# Patient Record
Sex: Female | Born: 2000 | Race: Black or African American | Hispanic: No | Marital: Single | Smoking: Never smoker
Health system: Southern US, Community
[De-identification: ages and names within clinical notes are randomized; demographics above are authoritative.]

## PROBLEM LIST (undated history)

## (undated) ENCOUNTER — Emergency Department (HOSPITAL_COMMUNITY): Admission: EM | Payer: Medicaid Other | Source: Home / Self Care

## (undated) ENCOUNTER — Inpatient Hospital Stay (HOSPITAL_COMMUNITY): Payer: Medicaid Other | Admitting: Obstetrics & Gynecology

## (undated) DIAGNOSIS — Z789 Other specified health status: Secondary | ICD-10-CM

## (undated) HISTORY — DX: Other specified health status: Z78.9

## (undated) HISTORY — PX: NO PAST SURGERIES: SHX2092

## (undated) HISTORY — PX: WISDOM TOOTH EXTRACTION: SHX21

---
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
## 2020-01-20 ENCOUNTER — Emergency Department (HOSPITAL_COMMUNITY)
Admission: EM | Admit: 2020-01-20 | Discharge: 2020-01-20 | Disposition: A | Discharge status: Home / Self Care | Payer: Medicaid Other | Attending: Emergency Medicine | Admitting: Emergency Medicine

## 2020-01-20 ENCOUNTER — Other Ambulatory Visit: Payer: Self-pay

## 2020-01-20 ENCOUNTER — Encounter (HOSPITAL_COMMUNITY): Payer: Self-pay | Admitting: Emergency Medicine

## 2020-01-20 DIAGNOSIS — Z20822 Contact with and (suspected) exposure to covid-19: Secondary | ICD-10-CM | POA: Diagnosis not present

## 2020-01-20 DIAGNOSIS — J069 Acute upper respiratory infection, unspecified: Secondary | ICD-10-CM | POA: Diagnosis not present

## 2020-01-20 DIAGNOSIS — R0981 Nasal congestion: Secondary | ICD-10-CM | POA: Diagnosis not present

## 2020-01-20 DIAGNOSIS — R05 Cough: Secondary | ICD-10-CM | POA: Diagnosis present

## 2020-01-20 DIAGNOSIS — Z7722 Contact with and (suspected) exposure to environmental tobacco smoke (acute) (chronic): Secondary | ICD-10-CM | POA: Insufficient documentation

## 2020-01-20 DIAGNOSIS — Z79899 Other long term (current) drug therapy: Secondary | ICD-10-CM | POA: Diagnosis not present

## 2020-01-20 DIAGNOSIS — R070 Pain in throat: Secondary | ICD-10-CM | POA: Diagnosis not present

## 2020-01-20 LAB — GROUP A STREP BY PCR: Group A Strep by PCR: NOT DETECTED

## 2020-01-20 NOTE — ED Provider Notes (Signed)
[facility]-EMERGENCY DEPT Provider Note   CSN: [id number] Arrival date & time: 01/20/20  1547     History Chief Complaint  Patient presents with  . Cough  . Sore Throat  . Nasal Congestion    Carla Stevens is a 19 y.o. female with no significant past medical history presents to the ED with a 4-day history of headache, congestion, sore throat, and dry eyes.  Patient had recently been evaluated in urgent care for flu-like symptoms on 12/10/2019 and PCR COVID-19 testing was negative.  Patient does not have a thermometer at home, but notes subjective fevers.  She states that she has had 3 separate episodes of strep pharyngitis in the past 12 months.  She states that this feels similar.  She also developed a mild, nonproductive cough this morning.  She denies any dizziness, blurred vision, difficulty swallowing, trismus, facial swelling, neck pain, wheezing, voice change, chest pain or shortness of breath, abdominal pain, nausea or vomiting, urinary symptoms, or changes in bowel habits.  She lives with her mother and grandmother who have been asymptomatic.  She did not receive her COVID-19 vaccination.  HPI     History reviewed. No pertinent past medical history.  There are no problems to display for this patient.   History reviewed. No pertinent surgical history.   OB History   No obstetric history on file.     Family History  Family history unknown: Yes    Social History   Tobacco Use  . Smoking status: Passive Smoke Exposure - Never Smoker  . Smokeless tobacco: Never Used  Substance Use Topics  . Alcohol use: No  . Drug use: No    Home Medications Prior to Admission medications   Medication Sig Start Date End Date Taking? Authorizing Provider  aspirin-sod bicarb-citric acid (ALKA-SELTZER) 325 MG TBEF tablet Take 650 mg by mouth once.    [provider]  HYDROcodone-acetaminophen (NORCO/VICODIN) 5-325 MG tablet Take 1 tablet by mouth every 6  (six) hours as needed for severe pain. 09/10/19   Cristina Gong, PA-C  ibuprofen (ADVIL,MOTRIN) 400 MG tablet Take 1 tablet (400 mg total) by mouth every 6 (six) hours as needed. 05/20/16   Ronnell Freshwater, NP  ondansetron (ZOFRAN ODT) 4 MG disintegrating tablet Take 1 tablet (4 mg total) by mouth every 8 (eight) hours as needed for nausea or vomiting. 12/10/19   Lamptey, Britta Mccreedy, MD  ondansetron (ZOFRAN) 4 MG tablet Take 1 tablet (4 mg total) by mouth every 6 (six) hours. 06/08/17   Maxwell Caul, PA-C  XULANE 150-35 MCG/24HR transdermal patch Place 1 patch onto the skin once a week. 08/25/19   [provider]    Allergies    Watermelon [citrullus vulgaris]  Review of Systems   Review of Systems  All other systems reviewed and are negative.   Physical Exam Updated Vital Signs BP 119/74 (BP Location: Left Arm)   Pulse 78   Temp 99 F (37.2 C) (Oral)   Resp 16   LMP 01/01/2020   SpO2 99%   Physical Exam Vitals and nursing note reviewed. Exam conducted with a chaperone present.  Constitutional:      General: She is not in acute distress.    Appearance: Normal appearance. She is not ill-appearing.  HENT:     Head: Normocephalic and atraumatic.     Mouth/Throat:     Comments: Patent oropharynx.  Significant tonsillar hypertrophy bilaterally with exudates noted.  No uvular deviation or  uvulitis.  No asymmetry.  No trismus.  Tolerate secretions well.  No tongue swelling or floor of mouth induration. Eyes:     General: No scleral icterus.    Conjunctiva/sclera: Conjunctivae normal.  Cardiovascular:     Rate and Rhythm: Normal rate and regular rhythm.     Pulses: Normal pulses.     Heart sounds: Normal heart sounds.  Pulmonary:     Effort: Pulmonary effort is normal. No respiratory distress.     Breath sounds: Normal breath sounds. No wheezing or rales.  Abdominal:     General: Abdomen is flat. There is no distension.     Palpations: Abdomen is  soft.     Tenderness: There is no abdominal tenderness. There is no guarding.  Musculoskeletal:     Cervical back: Normal range of motion and neck supple. No rigidity.  Skin:    General: Skin is dry.     Capillary Refill: Capillary refill takes less than 2 seconds.  Neurological:     Mental Status: She is alert and oriented to person, place, and time.     GCS: GCS eye subscore is 4. GCS verbal subscore is 5. GCS motor subscore is 6.  Psychiatric:        Mood and Affect: Mood normal.        Behavior: Behavior normal.        Thought Content: Thought content normal.     ED Results / Procedures / Treatments   Labs (all labs ordered are listed, but only abnormal results are displayed) Labs Reviewed  GROUP A STREP BY PCR  SARS CORONAVIRUS 2 (TAT 6-24 HRS)    EKG None  Radiology No results found.  Procedures Procedures (including critical care time)  Medications Ordered in ED Medications - No data to display  ED Course  I have reviewed the triage vital signs and the nursing notes.  Pertinent labs & imaging results that were available during my care of the patient were reviewed by me and considered in my medical decision making (see chart for details).    MDM Rules/Calculators/A&P                      Physical exam is concerning for strep pharyngitis, will obtain PCR testing.  If negative, patient is safe for discharge as her symptoms are likely suggestive of a upper respiratory infection or seasonal allergies.  Recommended that she obtain a eye lubricant over-the-counter pharmacy for her dry eye symptoms.  We also discussed conservative remedies such as throat lozenges and warm tea with honey for her sore throat symptoms.  She has been taking Tylenol Cold and flu, with some relief.  Encouraged patient to obtain a thermometer at pharmacy, as well.  Group A strep by PCR was not detected.  Will not treat with antibiotics.  Instead, will obtain send out PCR COVID-19 testing.   Educated patient on the over-the-counter medications for symptomatic relief.  Also encouraging her to take seasonal allergies and Flonase as this may be reflective of seasonal allergies, despite her reported subjective fevers and chills.  Strict ED return precautions discussed.  All of the evaluation and work-up results were discussed with the patient and any family at bedside. They were provided opportunity to ask any additional questions and have none at this time. They have expressed understanding of verbal discharge instructions as well as return precautions and are agreeable to the plan.   Carla Stevens was evaluated in Emergency Department on 01/20/2020  for the symptoms described in the history of present illness. She was evaluated in the context of the global COVID-19 pandemic, which necessitated consideration that the patient might be at risk for infection with the SARS-CoV-2 virus that causes COVID-19. Institutional protocols and algorithms that pertain to the evaluation of patients at risk for COVID-19 are in a state of rapid change based on information released by regulatory bodies including the CDC and federal and state organizations. These policies and algorithms were followed during the patient's care in the ED.  Final Clinical Impression(s) / ED Diagnoses Final diagnoses:  Viral URI with cough    Rx / DC Orders ED Discharge Orders    None       Corena Herter, PA-C 01/20/20 2015    Little, Wenda Overland, MD 01/21/20 [phone]

## 2020-01-20 NOTE — ED Triage Notes (Addendum)
Patient reports headache, congestion, cough, sore throat and burning eyes x4 days.

## 2020-01-20 NOTE — Discharge Instructions (Addendum)
Your history and physical exam is consistent with viral upper respiratory infection versus seasonal allergies.  I would like you to use throat lozenges for your sore throat symptoms and drink warm tea with honey.  You may also use Chloraseptic spray if your sore throat impedes her ability to eat and drink.  Given you are also having a headache, congestion, and dry eyes, may also be experiencing seasonal allergies.  I encourage you to take an over-the-counter antihistamine such as cetirizine (Zyrtec) on a daily basis and use fluticasone 1 spray each nare per day.  Additionally, given your dry symptoms, I would recommend Visine or similar ocular lubricant.    You will need to follow-up with your primary care provider regarding today's encounter.  Please continue to maintain isolation precautions pending results or COVID-19 testing.  Please obtain a thermometer over-the-counter so that you can assess for fevers and chills.  Please take ibuprofen or Tylenol as needed.  Discontinue NSAIDs if you become pregnant.  Return to the ED or seek immediate medical attention should you experience any new or worsening symptoms.

## 2020-01-20 NOTE — ED Notes (Signed)
An After Visit Summary was printed and given to the patient. Discharge instructions given and no further questions at this time.  

## 2020-01-21 LAB — SARS CORONAVIRUS 2 (TAT 6-24 HRS): SARS Coronavirus 2: NEGATIVE

## 2020-02-15 ENCOUNTER — Other Ambulatory Visit: Payer: Self-pay

## 2020-02-15 ENCOUNTER — Encounter (HOSPITAL_COMMUNITY): Payer: Self-pay | Admitting: *Deleted

## 2020-02-15 DIAGNOSIS — R05 Cough: Secondary | ICD-10-CM | POA: Diagnosis present

## 2020-02-15 DIAGNOSIS — U071 COVID-19: Secondary | ICD-10-CM | POA: Diagnosis not present

## 2020-02-15 DIAGNOSIS — R6883 Chills (without fever): Secondary | ICD-10-CM | POA: Diagnosis not present

## 2020-02-16 ENCOUNTER — Telehealth (HOSPITAL_COMMUNITY): Payer: Self-pay

## 2020-02-16 ENCOUNTER — Emergency Department (HOSPITAL_COMMUNITY)
Admission: EM | Admit: 2020-02-16 | Discharge: 2020-02-16 | Disposition: A | Discharge status: Home / Self Care | Payer: Medicaid Other | Attending: Emergency Medicine | Admitting: Emergency Medicine

## 2020-02-16 DIAGNOSIS — J069 Acute upper respiratory infection, unspecified: Secondary | ICD-10-CM

## 2020-02-16 LAB — SARS CORONAVIRUS 2 BY RT PCR (HOSPITAL ORDER, PERFORMED IN ~~LOC~~ HOSPITAL LAB): SARS Coronavirus 2: POSITIVE — AB

## 2020-02-16 NOTE — ED Provider Notes (Signed)
Carla Stevens   CSN: [id number] Arrival date & time: 02/15/20  2156     History Chief Complaint  Patient presents with  . Cough    Carla Stevens is a 19 y.o. female.  The history is provided by the patient.  URI Presenting symptoms: congestion and cough   Presenting symptoms: no ear pain, no fatigue, no fever, no rhinorrhea and no sore throat   Severity:  Mild Onset quality:  Gradual Timing:  Constant Progression:  Unchanged Chronicity:  New Relieved by:  Nothing Worsened by:  Nothing Associated symptoms: sinus pain   Associated symptoms: no arthralgias and no wheezing   Risk factors: no chronic respiratory disease        History reviewed. No pertinent past medical history.  There are no problems to display for this patient.   History reviewed. No pertinent surgical history.   OB History   No obstetric history on file.     Family History  Family history unknown: Yes    Social History   Tobacco Use  . Smoking status: Passive Smoke Exposure - Never Smoker  . Smokeless tobacco: Never Used  Substance Use Topics  . Alcohol use: No  . Drug use: No    Home Medications Prior to Admission medications   Medication Sig Start Date End Date Taking? Authorizing Provider  aspirin-sod bicarb-citric acid (ALKA-SELTZER) 325 MG TBEF tablet Take 650 mg by mouth once.    [provider]  HYDROcodone-acetaminophen (NORCO/VICODIN) 5-325 MG tablet Take 1 tablet by mouth every 6 (six) hours as needed for severe pain. 09/10/19   Lorin Glass, PA-C  ibuprofen (ADVIL,MOTRIN) 400 MG tablet Take 1 tablet (400 mg total) by mouth every 6 (six) hours as needed. 05/20/16   Benjamine Sprague, NP  ondansetron (ZOFRAN ODT) 4 MG disintegrating tablet Take 1 tablet (4 mg total) by mouth every 8 (eight) hours as needed for nausea or vomiting. 12/10/19   Lamptey, Myrene Galas, MD  ondansetron (ZOFRAN) 4 MG tablet Take 1  tablet (4 mg total) by mouth every 6 (six) hours. 06/08/17   Volanda Napoleon, PA-C  XULANE 150-35 MCG/24HR transdermal patch Place 1 patch onto the skin once a week. 08/25/19   [provider]    Allergies    Watermelon [citrullus vulgaris]  Review of Systems   Review of Systems  Constitutional: Positive for chills. Negative for fatigue and fever.  HENT: Positive for congestion and sinus pain. Negative for ear pain, rhinorrhea, sore throat, trouble swallowing and voice change.   Respiratory: Positive for cough. Negative for shortness of breath, wheezing and stridor.   Gastrointestinal: Negative for diarrhea, nausea and vomiting.  Musculoskeletal: Negative for arthralgias.    Physical Exam Updated Vital Signs BP 100/81 (BP Location: Right Arm)   Pulse 62   Temp 98.3 F (36.8 C) (Oral)   Resp 18   Ht 5\' 5"  (1.651 m)   Wt 49.9 kg   SpO2 100%   BMI 18.30 kg/m   Physical Exam Constitutional:      General: She is not in acute distress.    Appearance: She is not ill-appearing.  HENT:     Nose: Congestion and rhinorrhea present.     Mouth/Throat:     Mouth: Mucous membranes are moist.     Pharynx: No oropharyngeal exudate or posterior oropharyngeal erythema.  Eyes:     Pupils: Pupils are equal, round, and reactive to light.  Cardiovascular:  Pulses: Normal pulses.     Heart sounds: Normal heart sounds.  Pulmonary:     Effort: Pulmonary effort is normal. No respiratory distress.     Breath sounds: No stridor. No wheezing or rhonchi.  Skin:    Capillary Refill: Capillary refill takes less than 2 seconds.  Neurological:     General: No focal deficit present.     Mental Status: She is alert.     ED Results / Procedures / Treatments   Labs (all labs ordered are listed, but only abnormal results are displayed) Labs Reviewed  SARS CORONAVIRUS 2 BY RT PCR (HOSPITAL ORDER, PERFORMED IN [facility])    EKG None  Radiology No results  found.  Procedures Procedures (including critical care time)  Medications Ordered in ED Medications - No data to display  ED Course  I have reviewed the triage vital signs and the nursing notes.  Pertinent labs & imaging results that were available during my care of the patient were reviewed by me and considered in my medical decision making (see chart for details).    MDM Rules/Calculators/A&P                      Carla Stevens is a 19 year old female who presents to the ED with URI symptoms. Normal vitals. No fever. Symptoms for several days. Loss of taste and smell. Otherwise no major cough or shortness of breath. Clear breath sounds on exam. Overall well-appearing. Likely viral process. Will test for coronavirus as patient is not vaccinated. Family member also with illness as well. Given return precautions, will test for Covid, recommend self-isolation. Discharged in good condition. No concern for other infectious process at this time.  This chart was dictated using voice recognition software.  Despite best efforts to proofread,  errors can occur which can change the documentation meaning.  Carla Stevens was evaluated in Emergency Department on 02/16/2020 for the symptoms described in the history of present illness. She was evaluated in the context of the global COVID-19 pandemic, which necessitated consideration that the patient might be at risk for infection with the SARS-CoV-2 virus that causes COVID-19. Institutional protocols and algorithms that pertain to the evaluation of patients at risk for COVID-19 are in a state of rapid change based on information released by regulatory bodies including the CDC and federal and state organizations. These policies and algorithms were followed during the patient's care in the ED.     Final Clinical Impression(s) / ED Diagnoses Final diagnoses:  Upper respiratory tract infection, unspecified type    Rx / DC Orders ED Discharge Orders    None        Virgina Norfolk, DO 02/16/20 7096

## 2020-02-18 ENCOUNTER — Telehealth: Payer: Self-pay

## 2020-02-18 NOTE — Telephone Encounter (Signed)
Pt. Calling to report she has COVID 19. Asking if she can take OTC for her runny nose and cough. Instructed she could take OTC for her symptoms.Reviewed quarantine protocol. Verbalizes understanding. Instructed of symptoms worsen, return to ED.

## 2020-09-29 ENCOUNTER — Other Ambulatory Visit: Payer: Self-pay

## 2020-09-29 ENCOUNTER — Emergency Department (HOSPITAL_COMMUNITY)
Admission: EM | Admit: 2020-09-29 | Discharge: 2020-09-29 | Disposition: A | Discharge status: Home / Self Care | Payer: Medicaid Other | Attending: Emergency Medicine | Admitting: Emergency Medicine

## 2020-09-29 ENCOUNTER — Encounter (HOSPITAL_COMMUNITY): Payer: Self-pay

## 2020-09-29 DIAGNOSIS — Z5321 Procedure and treatment not carried out due to patient leaving prior to being seen by health care provider: Secondary | ICD-10-CM | POA: Diagnosis not present

## 2020-09-29 DIAGNOSIS — J029 Acute pharyngitis, unspecified: Secondary | ICD-10-CM | POA: Diagnosis present

## 2020-09-29 DIAGNOSIS — K1321 Leukoplakia of oral mucosa, including tongue: Secondary | ICD-10-CM | POA: Insufficient documentation

## 2020-09-29 LAB — GROUP A STREP BY PCR: Group A Strep by PCR: NOT DETECTED

## 2020-09-29 NOTE — ED Notes (Signed)
Pt called 3x for room placement. Eloped from waiting area.  

## 2020-09-29 NOTE — ED Triage Notes (Signed)
Pt reports sore throat and white patches in throat for 2 days.

## 2020-10-03 ENCOUNTER — Encounter (HOSPITAL_COMMUNITY): Payer: Self-pay

## 2020-10-03 ENCOUNTER — Ambulatory Visit (HOSPITAL_COMMUNITY)
Admission: EM | Admit: 2020-10-03 | Discharge: 2020-10-03 | Disposition: A | Discharge status: Home / Self Care | Payer: Medicaid Other | Attending: Student | Admitting: Student

## 2020-10-03 ENCOUNTER — Other Ambulatory Visit: Payer: Self-pay

## 2020-10-03 DIAGNOSIS — U071 COVID-19: Secondary | ICD-10-CM | POA: Insufficient documentation

## 2020-10-03 DIAGNOSIS — J069 Acute upper respiratory infection, unspecified: Secondary | ICD-10-CM | POA: Diagnosis present

## 2020-10-03 LAB — POCT RAPID STREP A, ED / UC: Streptococcus, Group A Screen (Direct): NEGATIVE

## 2020-10-03 LAB — SARS CORONAVIRUS 2 (TAT 6-24 HRS): SARS Coronavirus 2: POSITIVE — AB

## 2020-10-03 MED ORDER — AMOXICILLIN-POT CLAVULANATE 875-125 MG PO TABS: 1.0000 | ORAL_TABLET | Freq: Two times a day (BID) | ORAL | 0 refills | Status: DC

## 2020-10-03 NOTE — ED Triage Notes (Signed)
Patient complains of cough and congest intermittently since Christmas. Pt states the current episode has lasted about 1 week. Pt is aox4 and ambulatory.

## 2020-10-03 NOTE — Discharge Instructions (Addendum)
Augmentin twice daily for 7 days. 

## 2020-10-03 NOTE — ED Provider Notes (Signed)
[facility]    CSN: [id number] Arrival date & time: 10/03/20  1127      History   Chief Complaint Chief Complaint  Patient presents with  . Cough    X 1 week  . Nasal Congestion    X 1 week  . Sore Throat    X 1 week    HPI Carla Stevens is a 20 y.o. female Presenting for URI symptoms for 2 weeks. History of covid 03/2020 but is not vaccinated for covid yet. Endorses cough, sore throat, and congestion. States she started feeling better but then got worse again 3 days a go. Some facial pressure but no pain. Significant nasal congestion. Had a sore throat and tonsillar exudate but no longer. Denies fevers/chills, n/v/d, shortness of breath, chest pain, cough, ,teeth pain, headaches, sore throat, loss of taste/smell, swollen lymph nodes, ear pain.  Denies chest pain, shortness of breath, confusion, high fevers.  Not vaccinated for covid-19. Was covid positive 03/2020.    HPI  History reviewed. No pertinent past medical history.  There are no problems to display for this patient.   History reviewed. No pertinent surgical history.  OB History   No obstetric history on file.      Home Medications    Prior to Admission medications   Medication Sig Start Date End Date Taking? Authorizing Provider  amoxicillin-clavulanate (AUGMENTIN) 875-125 MG tablet Take 1 tablet by mouth every 12 (twelve) hours. 10/03/20  Yes Rhys Martini, PA-C  ibuprofen (ADVIL,MOTRIN) 400 MG tablet Take 1 tablet (400 mg total) by mouth every 6 (six) hours as needed. 05/20/16  Yes Ronnell Freshwater, NP  aspirin-sod bicarb-citric acid (ALKA-SELTZER) 325 MG TBEF tablet Take 650 mg by mouth once.    [provider]  Burr Medico 150-35 MCG/24HR transdermal patch Place 1 patch onto the skin once a week. 08/25/19 10/03/20  [provider]    Family History Family History  Family history unknown: Yes    Social History Social History   Tobacco Use  . Smoking status:  Passive Smoke Exposure - Never Smoker  . Smokeless tobacco: Never Used  Vaping Use  . Vaping Use: Never used  Substance Use Topics  . Alcohol use: No  . Drug use: No     Allergies   Watermelon [citrullus vulgaris]   Review of Systems Review of Systems  Constitutional: Negative for appetite change, chills and fever.  HENT: Positive for congestion and sore throat. Negative for ear pain, rhinorrhea, sinus pressure and sinus pain.   Eyes: Negative for redness and visual disturbance.  Respiratory: Positive for cough. Negative for chest tightness, shortness of breath and wheezing.   Cardiovascular: Negative for chest pain and palpitations.  Gastrointestinal: Negative for abdominal pain, constipation, diarrhea, nausea and vomiting.  Genitourinary: Negative for dysuria, frequency and urgency.  Musculoskeletal: Negative for myalgias.  Neurological: Negative for dizziness, weakness and headaches.  Psychiatric/Behavioral: Negative for confusion.  All other systems reviewed and are negative.    Physical Exam Triage Vital Signs ED Triage Vitals  Enc Vitals Group     BP 10/03/20 1235 (!) 152/94     Pulse Rate 10/03/20 1235 98     Resp 10/03/20 1235 18     Temp 10/03/20 1235 98.5 F (36.9 C)     Temp Source 10/03/20 1235 Oral     SpO2 10/03/20 1235 99 %     Weight --      Height --      Head Circumference --  Peak Flow --      Pain Score 10/03/20 1237 7     Pain Loc --      Pain Edu? --      Excl. in GC? --    No data found.  Updated Vital Signs BP (!) 152/94 (BP Location: Right Arm)   Pulse 98   Temp 98.5 F (36.9 C) (Oral)   Resp 18   LMP 08/30/2020   SpO2 99%   Visual Acuity Right Eye Distance:   Left Eye Distance:   Bilateral Distance:    Right Eye Near:   Left Eye Near:    Bilateral Near:     Physical Exam Vitals reviewed.  Constitutional:      General: She is not in acute distress.    Appearance: Normal appearance. She is not ill-appearing.  HENT:      Head: Normocephalic and atraumatic.     Right Ear: Hearing, tympanic membrane, ear canal and external ear normal. No swelling or tenderness. There is no impacted cerumen. No mastoid tenderness. Tympanic membrane is not perforated, erythematous, retracted or bulging.     Left Ear: Hearing, tympanic membrane, ear canal and external ear normal. No swelling or tenderness. There is no impacted cerumen. No mastoid tenderness. Tympanic membrane is not perforated, erythematous, retracted or bulging.     Nose:     Right Sinus: No maxillary sinus tenderness or frontal sinus tenderness.     Left Sinus: No maxillary sinus tenderness or frontal sinus tenderness.     Mouth/Throat:     Mouth: Mucous membranes are moist.     Pharynx: Uvula midline. Posterior oropharyngeal erythema present. No oropharyngeal exudate.     Tonsils: No tonsillar exudate. 2+ on the right. 2+ on the left.  Cardiovascular:     Rate and Rhythm: Normal rate and regular rhythm.     Heart sounds: Normal heart sounds.  Pulmonary:     Breath sounds: Normal breath sounds and air entry. No wheezing, rhonchi or rales.  Chest:     Chest wall: No tenderness.  Abdominal:     General: Abdomen is flat. Bowel sounds are normal.     Tenderness: There is no abdominal tenderness. There is no guarding or rebound.  Lymphadenopathy:     Cervical: No cervical adenopathy.  Neurological:     General: No focal deficit present.     Mental Status: She is alert and oriented to person, place, and time.  Psychiatric:        Attention and Perception: Attention and perception normal.        Mood and Affect: Mood and affect normal.        Behavior: Behavior normal. Behavior is cooperative.        Thought Content: Thought content normal.        Judgment: Judgment normal.      UC Treatments / Results  Labs (all labs ordered are listed, but only abnormal results are displayed) Labs Reviewed  SARS CORONAVIRUS 2 (TAT 6-24 HRS)  CULTURE, GROUP A  STREP [facility])  POCT RAPID STREP A, ED / UC    EKG   Radiology No results found.  Procedures Procedures (including critical care time)  Medications Ordered in UC Medications - No data to display  Initial Impression / Assessment and Plan / UC Course  I have reviewed the triage vital signs and the nursing notes.  Pertinent labs & imaging results that were available during my care of the patient were reviewed by  me and considered in my medical decision making (see chart for details).     Covid test sent today. Patient is not vaccinated for covid-19- but had covid 03/2020. Isolation precautions per CDC guidelines until negative result. Symptomatic relief with OTC Mucinex, Nyquil, etc. Return precautions- new/worsening fevers/chills, shortness of breath, chest pain, abd pain, etc.   Rapid strep negative. Culture ordered.  Given secondary sickening phenomenon, significant nasal congestion, and sinus pressure- augmentin sent as below.  Final Clinical Impressions(s) / UC Diagnoses   Final diagnoses:  Viral upper respiratory tract infection     Discharge Instructions     -Augmentin twice daily for 7 days.     ED Prescriptions    Medication Sig Dispense Auth. Provider   amoxicillin-clavulanate (AUGMENTIN) 875-125 MG tablet Take 1 tablet by mouth every 12 (twelve) hours. 14 tablet Rhys Martini, PA-C     PDMP not reviewed this encounter.   Rhys Martini, PA-C 10/03/20 1417

## 2020-10-06 LAB — CULTURE, GROUP A STREP (THRC)

## 2020-11-09 ENCOUNTER — Emergency Department (HOSPITAL_COMMUNITY): Payer: Medicaid Other

## 2020-11-09 ENCOUNTER — Encounter (HOSPITAL_COMMUNITY): Payer: Self-pay | Admitting: Emergency Medicine

## 2020-11-09 ENCOUNTER — Other Ambulatory Visit: Payer: Self-pay

## 2020-11-09 ENCOUNTER — Emergency Department (HOSPITAL_COMMUNITY)
Admission: EM | Admit: 2020-11-09 | Discharge: 2020-11-09 | Disposition: A | Discharge status: Home / Self Care | Payer: Medicaid Other | Attending: Emergency Medicine | Admitting: Emergency Medicine

## 2020-11-09 DIAGNOSIS — Z7982 Long term (current) use of aspirin: Secondary | ICD-10-CM | POA: Insufficient documentation

## 2020-11-09 DIAGNOSIS — S9032XA Contusion of left foot, initial encounter: Secondary | ICD-10-CM | POA: Diagnosis not present

## 2020-11-09 DIAGNOSIS — Y9301 Activity, walking, marching and hiking: Secondary | ICD-10-CM | POA: Diagnosis not present

## 2020-11-09 DIAGNOSIS — W108XXA Fall (on) (from) other stairs and steps, initial encounter: Secondary | ICD-10-CM | POA: Diagnosis not present

## 2020-11-09 DIAGNOSIS — Z7722 Contact with and (suspected) exposure to environmental tobacco smoke (acute) (chronic): Secondary | ICD-10-CM | POA: Insufficient documentation

## 2020-11-09 DIAGNOSIS — Z23 Encounter for immunization: Secondary | ICD-10-CM | POA: Insufficient documentation

## 2020-11-09 DIAGNOSIS — S99922A Unspecified injury of left foot, initial encounter: Secondary | ICD-10-CM

## 2020-11-09 DIAGNOSIS — S0081XA Abrasion of other part of head, initial encounter: Secondary | ICD-10-CM | POA: Diagnosis not present

## 2020-11-09 MED ORDER — TETANUS-DIPHTH-ACELL PERTUSSIS 5-2.5-18.5 LF-MCG/0.5 IM SUSY
0.5000 mL | PREFILLED_SYRINGE | Freq: Once | INTRAMUSCULAR | Status: AC
  Administered 2020-11-09: 0.5 mL via INTRAMUSCULAR

## 2020-11-09 MED ORDER — TETANUS-DIPHTH-ACELL PERTUSSIS 5-2.5-18.5 LF-MCG/0.5 IM SUSY
PREFILLED_SYRINGE | INTRAMUSCULAR | Status: AC
  Filled 2020-11-09: qty 0.5

## 2020-11-09 NOTE — ED Triage Notes (Signed)
Patient complains of tripping and falling on concrete steps last night, complains of abrasion and pain to left pinky toe. No other complaints at this time.

## 2020-11-09 NOTE — ED Provider Notes (Signed)
[facility] EMERGENCY DEPARTMENT Provider Note   CSN: [id number] Arrival date & time: 11/09/20  1051     History Chief Complaint  Patient presents with  . Fall    Carla Stevens is a 20 y.o. female.  HPI      Carla Stevens is a 20 y.o. female, patient with no pertinent past medical history, presenting to the ED with injuries from a fall that occurred last night. Patient states she was walking in slippers, tripped on a rug, and fell down a few stairs. She arrives in ED complaining primarily of pain to the left foot.  Unknown last tetanus vaccination.  Denies anticoagulation.  Denies LOC, nausea/vomiting, neck/back pain, chest pain, other extremity pain, numbness, weakness, syncope, or any other complaints.  History reviewed. No pertinent past medical history.  There are no problems to display for this patient.   History reviewed. No pertinent surgical history.   OB History   No obstetric history on file.     Family History  Family history unknown: Yes    Social History   Tobacco Use  . Smoking status: Passive Smoke Exposure - Never Smoker  . Smokeless tobacco: Never Used  Vaping Use  . Vaping Use: Never used  Substance Use Topics  . Alcohol use: No  . Drug use: No    Home Medications Prior to Admission medications   Medication Sig Start Date End Date Taking? Authorizing Provider  aspirin-sod bicarb-citric acid (ALKA-SELTZER) 325 MG TBEF tablet Take 650 mg by mouth once.    [provider]  ibuprofen (ADVIL,MOTRIN) 400 MG tablet Take 1 tablet (400 mg total) by mouth every 6 (six) hours as needed. 05/20/16   Ronnell Freshwater, NP  Burr Medico 150-35 MCG/24HR transdermal patch Place 1 patch onto the skin once a week. 08/25/19 10/03/20  [provider]    Allergies    Watermelon [citrullus vulgaris]  Review of Systems   Review of Systems  Constitutional: Negative for diaphoresis.  Eyes: Negative for visual disturbance.   Respiratory: Negative for shortness of breath.   Cardiovascular: Negative for chest pain.  Gastrointestinal: Negative for abdominal pain, nausea and vomiting.  Musculoskeletal: Positive for arthralgias. Negative for back pain and neck pain.  Skin: Positive for wound.  Neurological: Negative for weakness and numbness.  All other systems reviewed and are negative.   Physical Exam Updated Vital Signs BP 117/76 (BP Location: Left Arm)   Pulse 75   Temp 99.1 F (37.3 C) (Oral)   Resp 14   SpO2 100%   Physical Exam Vitals and nursing note reviewed.  Constitutional:      General: She is not in acute distress.    Appearance: She is well-developed. She is not diaphoretic.  HENT:     Head: Normocephalic.     Comments: Abrasions to the right side of the patient's face.  No swelling, deformity, tenderness, instability.    Mouth/Throat:     Mouth: Mucous membranes are moist.     Pharynx: Oropharynx is clear.     Comments: Dentition appears to be intact and stable.  No noted area of intraoral swelling or injury.  No trismus or noted abnormal phonation.  Mouth opening to at least 3 finger widths.  Handles oral secretions without difficulty.  No noted facial swelling.  No swelling or tenderness to the submental or submandibular regions.  Full range of motion in the mandible without pain or difficulty.  No swelling or tenderness into the soft tissues of  the neck. Eyes:     Conjunctiva/sclera: Conjunctivae normal.  Cardiovascular:     Rate and Rhythm: Normal rate and regular rhythm.     Pulses: Normal pulses.          Radial pulses are 2+ on the right side and 2+ on the left side.       Posterior tibial pulses are 2+ on the right side and 2+ on the left side.     Comments: Tactile temperature in the extremities appropriate and equal bilaterally. Pulmonary:     Effort: Pulmonary effort is normal. No respiratory distress.  Abdominal:     Tenderness: There is no guarding.  Musculoskeletal:      Cervical back: Normal range of motion and neck supple. No tenderness.     Comments: Tenderness and bruising to the left big toe without deformity, instability. Abrasion to the left fourth toe without deformity or instability. Superficial appearing avulsion to the dorsal left fifth toe without deformity.  Normal motor function intact in all extremities. No midline spinal tenderness.   Skin:    General: Skin is warm and dry.  Neurological:     Mental Status: She is alert and oriented to person, place, and time.     Comments: No noted acute cognitive deficit. Sensation grossly intact to light touch in the extremities.   Grip strengths equal bilaterally.   Strength 5/5 in all extremities.  No gait disturbance.  Coordination intact.  Cranial nerves III-XII grossly intact.  Handles oral secretions without noted difficulty.  No noted phonation or speech deficit. No facial droop.   Psychiatric:        Mood and Affect: Mood and affect normal.        Speech: Speech normal.        Behavior: Behavior normal.     ED Results / Procedures / Treatments   Labs (all labs ordered are listed, but only abnormal results are displayed) Labs Reviewed - No data to display  EKG None  Radiology DG Foot Complete Left  Result Date: 11/09/2020 CLINICAL DATA:  Pain, fall today EXAM: LEFT FOOT - COMPLETE 3+ VIEW COMPARISON:  None. FINDINGS: There is no evidence of fracture or dislocation. There is no evidence of arthropathy or other focal bone abnormality. Soft tissues are unremarkable. IMPRESSION: Negative. Electronically Signed   By: Guadlupe Spanish M.D.   On: 11/09/2020 12:19    Procedures Procedures   Medications Ordered in ED Medications  Tdap (BOOSTRIX) injection 0.5 mL (0.5 mLs Intramuscular Given 11/09/20 1459)    ED Course  I have reviewed the triage vital signs and the nursing notes.  Pertinent labs & imaging results that were available during my care of the patient were reviewed by me  and considered in my medical decision making (see chart for details).    MDM Rules/Calculators/A&P                          Patient presents primarily complaining of foot injury. No evidence of serious head injury requiring imaging.  I personally reviewed and interpreted the patient's foot x-rays.  No acute abnormalities on these x-rays. Patient states she has crutches at home to be weightbearing as needed. The patient was given instructions for home care as well as return precautions. Patient voices understanding of these instructions, accepts the plan, and is comfortable with discharge.   Final Clinical Impression(s) / ED Diagnoses Final diagnoses:  Foot injury, left, initial encounter  Rx / DC Orders ED Discharge Orders    None       Concepcion Living 11/09/20 1517    Linwood Dibbles, MD 11/10/20 [phone]

## 2020-11-09 NOTE — Medical Student Note (Incomplete)
[facility]-EMERGENCY DEPT Provider Student Note For educational purposes for Medical, PA and NP students only and not part of the legal medical record.   CSN: [id number] Arrival date & time: 11/09/20  1051      History   Chief Complaint Chief Complaint  Patient presents with  . Fall    HPI Carla Stevens is a 20 y.o. female with no significant PMH who presents to ED after mechanical fall. States that she was leaving a friends house last night when she tripped, fell, and then hit her left 5th metatarsal on the door frame. She notes immediate pain and abrasion to the toe. Endorses hitting her face but no LOC. No headache or neck pain today.   This morning she woke up with severe pain in the toe and decided to come to ED. She denies fevers. She is able to bear some weight on the foot. Denies trying any pain medications. She does not know her last tetanus shot.   History reviewed. No pertinent past medical history.  There are no problems to display for this patient.   History reviewed. No pertinent surgical history.  OB History   No obstetric history on file.     Home Medications    Prior to Admission medications   Medication Sig Start Date End Date Taking? Authorizing Provider  amoxicillin-clavulanate (AUGMENTIN) 875-125 MG tablet Take 1 tablet by mouth every 12 (twelve) hours. 10/03/20   Rhys Martini, PA-C  aspirin-sod bicarb-citric acid (ALKA-SELTZER) 325 MG TBEF tablet Take 650 mg by mouth once.    [provider]  ibuprofen (ADVIL,MOTRIN) 400 MG tablet Take 1 tablet (400 mg total) by mouth every 6 (six) hours as needed. 05/20/16   Ronnell Freshwater, NP  [facility]r [facility]ico 150-35 MCG/24HR transdermal patch Place 1 patch onto the skin once a week. 08/25/19 10/03/20  [provider]    Family History Family History  Family history unknown: Yes    Social History Social History   Tobacco Use  . Smoking status: Passive Smoke Exposure - Never Smoker  .  Smokeless tobacco: Never Used  Vaping Use  . Vaping Use: Never used  Substance Use Topics  . Alcohol use: No  . Drug use: No     Allergies   Watermelon [citrullus vulgaris]   Review of Systems Review of Systems  Constitutional: Negative for fever.  HENT:       Abrasion to right face  Eyes: Negative.   Respiratory: Negative.   Cardiovascular: Negative.   Gastrointestinal: Negative.   Endocrine: Negative.   Genitourinary: Negative.   Musculoskeletal: Positive for joint swelling. Negative for neck pain.       Left 5th metatarasal  Skin: Positive for wound.       Left 5th metatarsal with skin tear. Nail is disrupted. Serous drainage from wound  Allergic/Immunologic: Negative.   Neurological: Negative for dizziness, weakness and headaches.  Hematological: Negative.   Psychiatric/Behavioral: Negative.    Physical Exam Updated Vital Signs BP 117/76 (BP Location: Left Arm)   Pulse 75   Temp 99.1 F (37.3 C) (Oral)   Resp 14   SpO2 100%   Physical Exam Constitutional:      General: She is not in acute distress.    Appearance: She is not toxic-appearing.  HENT:     Head: Normocephalic.     Nose: Nose normal.     Mouth/Throat:     Mouth: Mucous membranes are dry.  Eyes:     Extraocular Movements:  Extraocular movements intact.     Pupils: Pupils are equal, round, and reactive to light.  Cardiovascular:     Pulses: Normal pulses.  Pulmonary:     Effort: Pulmonary effort is normal.  Musculoskeletal:     Cervical back: Normal range of motion and neck supple. No tenderness.     Right foot: Normal.     Left foot: Swelling, laceration, tenderness and bony tenderness present.       Legs:  Skin:    General: Skin is warm and dry.     Capillary Refill: Capillary refill takes less than 2 seconds.  Neurological:     General: No focal deficit present.     Mental Status: She is alert.      ED Treatments / Results  Labs (all labs ordered are listed, but only abnormal  results are displayed) Labs Reviewed - No data to display  EKG  Radiology DG Foot Complete Left  Result Date: 11/09/2020 CLINICAL DATA:  Pain, fall today EXAM: LEFT FOOT - COMPLETE 3+ VIEW COMPARISON:  None. FINDINGS: There is no evidence of fracture or dislocation. There is no evidence of arthropathy or other focal bone abnormality. Soft tissues are unremarkable. IMPRESSION: Negative. Electronically Signed   By: Guadlupe Spanish M.D.   On: 11/09/2020 12:19    Procedures Procedures (including critical care time)  Medications Ordered in ED Medications - No data to display   Initial Impression / Assessment and Plan / ED Course  I have reviewed the triage vital signs and the nursing notes.  Pertinent labs & imaging results that were available during my care of the patient were reviewed by me and considered in my medical decision making (see chart for details).  Carla Stevens is a 19yoF with PMH and HPI as listed above who presents with fall last night.  Xray of the left foot shows no evidence of fracture or dislocation. No arthropathy or bony abnormality. No soft tissue swelling.  Rest, OTC tylenol/ibuprofen for pain relief. Can ice and elevate the extremity to promote swelling reduction.   Final Clinical Impressions(s) / ED Diagnoses   Final diagnoses:  None    New Prescriptions New Prescriptions   No medications on file

## 2020-11-09 NOTE — Discharge Instructions (Signed)
You have been seen today for a foot injury. There were no acute abnormalities on the x-rays, including no sign of fracture or dislocation, however, there could be injuries to the soft tissues, such as the ligaments or tendons that are not seen on xrays. There could also be what are called occult fractures that are small fractures not seen on xray. Antiinflammatory medications: Take 600 mg of ibuprofen every 6 hours or 440 mg (over the counter dose) to 500 mg (prescription dose) of naproxen every 12 hours for the next 3 days. After this time, these medications may be used as needed for pain. Take these medications with food to avoid upset stomach. Choose only one of these medications, do not take them together. Acetaminophen (generic for Tylenol): Should you continue to have additional pain while taking the ibuprofen or naproxen, you may add in acetaminophen as needed. Your daily total maximum amount of acetaminophen from all sources should be limited to 4000mg /day for persons without liver problems, or 2000mg /day for those with liver problems. Ice: May apply ice to the area over the next 24 hours for 15 minutes at a time to reduce swelling. Elevation: Keep the extremity elevated as often as possible to reduce pain and inflammation. Support: Wear the postop shoe for support and comfort. Wear this until pain resolves. You will be weight-bearing as tolerated, which means you can slowly start to put weight on the extremity and increase amount and frequency as pain allows. Follow up: If symptoms are improving, you may follow up with your primary care provider for any continued management. If symptoms are not starting to improve within a week, you should follow up with the foot specialist within two weeks. Return: Return to the ED for numbness, weakness, increasing pain, overall worsening symptoms, loss of function, or if symptoms are not improving, you have tried to follow up with the orthopedic specialist, and  have been unable to do so.  For prescription assistance, may try using prescription discount sites or apps, such as goodrx.com or Good Rx smart phone app.

## 2020-11-09 NOTE — ED Notes (Signed)
ED Provider at bedside. 

## 2020-12-16 ENCOUNTER — Emergency Department (HOSPITAL_COMMUNITY)
Admission: EM | Admit: 2020-12-16 | Discharge: 2020-12-16 | Disposition: A | Discharge status: Home / Self Care | Payer: Medicaid Other | Attending: Emergency Medicine | Admitting: Emergency Medicine

## 2020-12-16 ENCOUNTER — Emergency Department (HOSPITAL_COMMUNITY): Payer: Medicaid Other

## 2020-12-16 ENCOUNTER — Encounter (HOSPITAL_COMMUNITY): Payer: Self-pay | Admitting: Emergency Medicine

## 2020-12-16 DIAGNOSIS — X58XXXA Exposure to other specified factors, initial encounter: Secondary | ICD-10-CM | POA: Insufficient documentation

## 2020-12-16 DIAGNOSIS — S99922A Unspecified injury of left foot, initial encounter: Secondary | ICD-10-CM | POA: Diagnosis present

## 2020-12-16 DIAGNOSIS — Z7982 Long term (current) use of aspirin: Secondary | ICD-10-CM | POA: Diagnosis not present

## 2020-12-16 DIAGNOSIS — S92535A Nondisplaced fracture of distal phalanx of left lesser toe(s), initial encounter for closed fracture: Secondary | ICD-10-CM

## 2020-12-16 DIAGNOSIS — S92505A Nondisplaced unspecified fracture of left lesser toe(s), initial encounter for closed fracture: Secondary | ICD-10-CM | POA: Insufficient documentation

## 2020-12-16 DIAGNOSIS — Z7722 Contact with and (suspected) exposure to environmental tobacco smoke (acute) (chronic): Secondary | ICD-10-CM | POA: Insufficient documentation

## 2020-12-16 NOTE — ED Provider Notes (Signed)
[facility] EMERGENCY DEPARTMENT Provider Note   CSN: [id number] Arrival date & time: 12/16/20  1202     History Chief Complaint  Patient presents with  . Toe Pain    Carla Stevens is a 20 y.o. female with no pertinent past medical history that presents the emerge department today for left fifth toe pain.  Patient states that a month ago she fell and hurt her foot, was placed in a boot, specifically hurt her left fifth toe..  Patient states that she took the boot off around March 10, pain got better however was not significantly gone and then yesterday she started having left fifth toe pain severely again.  Patient states that the toe hurts when she is on her tippy toes.  Does not hurt at other times.  States that it is worse when she abducts and adducts her toe.  Denies any repeat trauma to this area.  Denies any swelling, erythema.  Denies any numbness or tingling.  Denies any foot pain elsewhere.  Denies fevers.  Has not seen anyone else for this.  No other complaints.Marland Kitchen  HPI     History reviewed. No pertinent past medical history.  There are no problems to display for this patient.   History reviewed. No pertinent surgical history.   OB History   No obstetric history on file.     Family History  Family history unknown: Yes    Social History   Tobacco Use  . Smoking status: Passive Smoke Exposure - Never Smoker  . Smokeless tobacco: Never Used  Vaping Use  . Vaping Use: Never used  Substance Use Topics  . Alcohol use: No  . Drug use: No    Home Medications Prior to Admission medications   Medication Sig Start Date End Date Taking? Authorizing Provider  aspirin-sod bicarb-citric acid (ALKA-SELTZER) 325 MG TBEF tablet Take 650 mg by mouth once.    [provider]  ibuprofen (ADVIL,MOTRIN) 400 MG tablet Take 1 tablet (400 mg total) by mouth every 6 (six) hours as needed. 05/20/16   Ronnell Freshwater, NP  Burr Medico 150-35 MCG/24HR  transdermal patch Place 1 patch onto the skin once a week. 08/25/19 10/03/20  [provider]    Allergies    Watermelon [citrullus vulgaris]  Review of Systems   Review of Systems  Constitutional: Negative for diaphoresis, fatigue and fever.  Eyes: Negative for visual disturbance.  Respiratory: Negative for shortness of breath.   Cardiovascular: Negative for chest pain.  Gastrointestinal: Negative for nausea and vomiting.  Musculoskeletal: Positive for arthralgias. Negative for back pain and myalgias.  Skin: Negative for color change, pallor, rash and wound.  Neurological: Negative for syncope, weakness, light-headedness, numbness and headaches.  Psychiatric/Behavioral: Negative for behavioral problems and confusion.    Physical Exam Updated Vital Signs BP 103/62 (BP Location: Left Arm)   Pulse 72   Temp 99.1 F (37.3 C) (Oral)   Resp 15   LMP 11/25/2020   SpO2 100%   Physical Exam Constitutional:      General: She is not in acute distress.    Appearance: Normal appearance. She is not ill-appearing, toxic-appearing or diaphoretic.  Cardiovascular:     Rate and Rhythm: Normal rate and regular rhythm.     Pulses: Normal pulses.  Pulmonary:     Effort: Pulmonary effort is normal.     Breath sounds: Normal breath sounds.  Musculoskeletal:        General: Normal range of motion.  Feet:  Comments: No tenderness to palpation of left fifth toe, however when I abduct and adduct toe she does have discomfort.  No discomfort to flexion and extension of toe.  Patient is able to bear weight with normal gait.  PT pulses are 2+.  Normal strength and sensation of toes and ankle. Skin:    General: Skin is warm and dry.     Capillary Refill: Capillary refill takes less than 2 seconds.  Neurological:     General: No focal deficit present.     Mental Status: She is alert and oriented to person, place, and time.  Psychiatric:        Mood and Affect: Mood normal.         Behavior: Behavior normal.        Thought Content: Thought content normal.     ED Results / Procedures / Treatments   Labs (all labs ordered are listed, but only abnormal results are displayed) Labs Reviewed - No data to display  EKG None  Radiology No results found.  Procedures Procedures   Medications Ordered in ED Medications - No data to display  ED Course  I have reviewed the triage vital signs and the nursing notes.  Pertinent labs & imaging results that were available during my care of the patient were reviewed by me and considered in my medical decision making (see chart for details).    MDM Rules/Calculators/A&P                          Carla Stevens is a 20 y.o. female with no pertinent past medical history that presents the emerge department today for left fifth toe pain.  Patient is distally neurovascularly intact, plain films show acute to subacute toe fracture. .  No concerns of infection, septic joint, ingrown nail.   Patient follow-up with podiatry for ongoing toe pain. Doubt need for further emergent work up at this time. I explained the diagnosis and have given explicit precautions to return to the ER including for any other new or worsening symptoms. The patient understands and accepts the medical plan as it's been dictated and I have answered their questions. Discharge instructions concerning home care and prescriptions have been given. The patient is STABLE and is discharged to home in good condition.      Final Clinical Impression(s) / ED Diagnoses Final diagnoses:  Pain of toe of left foot    Rx / DC Orders ED Discharge Orders    None       Farrel Gordon, PA-C 12/16/20 1537    Jacalyn Lefevre, MD 12/16/20 1615

## 2020-12-16 NOTE — ED Triage Notes (Signed)
Pt reports L 5th toe pain.  States she was seen in ED in mid February for toe pain and wore boot x 1 month.  States she continues to have pain since she stopped wearing the boot.

## 2020-12-16 NOTE — ED Notes (Signed)
Patient verbalizes understanding of discharge instructions. Opportunity for questioning and answers were provided. Pt discharged from ED. 

## 2020-12-16 NOTE — Discharge Instructions (Addendum)
Please read and follow all provided instructions.   Tests performed today include: An x-ray of the affected area does show a small fracture of your left fifth toe.  You should follow-up with podiatry. Vital signs. See below for your results today.   Home care instructions: -- *PRICE in the first 24-48 hours after injury: Protect ( Rest Ice- Do not apply ice pack directly to your skin, place towel or similar between your skin and ice/ice pack. Apply ice for 20 min, then remove for 40 min while awake Compression- Wear brace, elastic bandage, splint as directed by your provider Elevate affected extremity above the level of your heart when not walking around for the first 24-48 hours   Use Ibuprofen (Motrin/Advil) 600mg  every 6 hours as needed for pain (do not exceed max dose in 24 hours, 2400mg )  Follow-up instructions: Please follow-up with your primary care provider or the provided orthopedic physician (bone specialist) if you continue to have significant pain in 1 week. In this case you may have a more severe injury that requires further care.   Return instructions:  Please return if your toes or feet are numb or tingling, appear gray or blue, or you have severe pain (also elevate the leg and loosen splint or wrap if you were given one) Please return to the Emergency Department if you experience worsening symptoms.  Please return if you have any other emergent concerns. Additional Information:  Your vital signs today were: BP 103/62 (BP Location: Left Arm)   Pulse 72   Temp 99.1 F (37.3 C) (Oral)   Resp 15   LMP 11/25/2020   SpO2 100%  If your blood pressure (BP) was elevated above 135/85 this visit, please have this repeated by your doctor within one month. ---------------

## 2021-01-18 ENCOUNTER — Emergency Department (HOSPITAL_COMMUNITY): Payer: Medicaid Other

## 2021-01-18 ENCOUNTER — Encounter (HOSPITAL_COMMUNITY): Payer: Self-pay

## 2021-01-18 ENCOUNTER — Other Ambulatory Visit: Payer: Self-pay

## 2021-01-18 ENCOUNTER — Emergency Department (HOSPITAL_COMMUNITY)
Admission: EM | Admit: 2021-01-18 | Discharge: 2021-01-19 | Disposition: A | Discharge status: Home / Self Care | Payer: Medicaid Other | Attending: Emergency Medicine | Admitting: Emergency Medicine

## 2021-01-18 DIAGNOSIS — R350 Frequency of micturition: Secondary | ICD-10-CM | POA: Insufficient documentation

## 2021-01-18 DIAGNOSIS — R1033 Periumbilical pain: Secondary | ICD-10-CM | POA: Insufficient documentation

## 2021-01-18 DIAGNOSIS — D72829 Elevated white blood cell count, unspecified: Secondary | ICD-10-CM | POA: Insufficient documentation

## 2021-01-18 DIAGNOSIS — Z7722 Contact with and (suspected) exposure to environmental tobacco smoke (acute) (chronic): Secondary | ICD-10-CM | POA: Insufficient documentation

## 2021-01-18 DIAGNOSIS — R109 Unspecified abdominal pain: Secondary | ICD-10-CM

## 2021-01-18 LAB — URINALYSIS, ROUTINE W REFLEX MICROSCOPIC
Bilirubin Urine: NEGATIVE
Glucose, UA: NEGATIVE mg/dL
Ketones, ur: 5 mg/dL — AB
Nitrite: NEGATIVE
Protein, ur: NEGATIVE mg/dL
Specific Gravity, Urine: 1.015 (ref 1.005–1.030)
pH: 5 (ref 5.0–8.0)

## 2021-01-18 LAB — CBC WITH DIFFERENTIAL/PLATELET
Abs Immature Granulocytes: 0.04 10*3/uL (ref 0.00–0.07)
Basophils Absolute: 0 10*3/uL (ref 0.0–0.1)
Basophils Relative: 0 %
Eosinophils Absolute: 0.3 10*3/uL (ref 0.0–0.5)
Eosinophils Relative: 2 %
HCT: 42.4 % (ref 36.0–46.0)
Hemoglobin: 13.5 g/dL (ref 12.0–15.0)
Immature Granulocytes: 0 %
Lymphocytes Relative: 35 %
Lymphs Abs: 4.4 10*3/uL — ABNORMAL HIGH (ref 0.7–4.0)
MCH: 27.4 pg (ref 26.0–34.0)
MCHC: 31.8 g/dL (ref 30.0–36.0)
MCV: 86 fL (ref 80.0–100.0)
Monocytes Absolute: 1.1 10*3/uL — ABNORMAL HIGH (ref 0.1–1.0)
Monocytes Relative: 9 %
Neutro Abs: 6.6 10*3/uL (ref 1.7–7.7)
Neutrophils Relative %: 54 %
Platelets: 347 10*3/uL (ref 150–400)
RBC: 4.93 MIL/uL (ref 3.87–5.11)
RDW: 13.2 % (ref 11.5–15.5)
WBC: 12.5 10*3/uL — ABNORMAL HIGH (ref 4.0–10.5)
nRBC: 0 % (ref 0.0–0.2)

## 2021-01-18 LAB — HCG, QUANTITATIVE, PREGNANCY: hCG, Beta Chain, Quant, S: <1 m[IU]/mL (ref <5)

## 2021-01-18 LAB — COMPREHENSIVE METABOLIC PANEL
ALT: 15 U/L (ref 0–44)
AST: 20 U/L (ref 15–41)
Albumin: 4.7 g/dL (ref 3.5–5.0)
Alkaline Phosphatase: 57 U/L (ref 38–126)
Anion gap: 11 (ref 5–15)
BUN: 16 mg/dL (ref 6–20)
CO2: 25 mmol/L (ref 22–32)
Calcium: 10.7 mg/dL — ABNORMAL HIGH (ref 8.9–10.3)
Chloride: 105 mmol/L (ref 98–111)
Creatinine, Ser: 0.67 mg/dL (ref 0.44–1.00)
GFR, Estimated: >60 mL/min (ref >60)
Glucose, Bld: 86 mg/dL (ref 70–99)
Potassium: 3.8 mmol/L (ref 3.5–5.1)
Sodium: 141 mmol/L (ref 135–145)
Total Bilirubin: 0.6 mg/dL (ref 0.3–1.2)
Total Protein: 8.4 g/dL — ABNORMAL HIGH (ref 6.5–8.1)

## 2021-01-18 LAB — LIPASE, BLOOD: Lipase: 31 U/L (ref 11–51)

## 2021-01-18 MED ORDER — ONDANSETRON HCL 4 MG/2ML IJ SOLN
4.0000 mg | Freq: Once | INTRAMUSCULAR | Status: AC
  Administered 2021-01-18: 4 mg via INTRAVENOUS
  Filled 2021-01-18: qty 2

## 2021-01-18 MED ORDER — MORPHINE SULFATE (PF) 4 MG/ML IV SOLN
4.0000 mg | Freq: Once | INTRAVENOUS | Status: AC
  Administered 2021-01-18: 4 mg via INTRAVENOUS
  Filled 2021-01-18: qty 1

## 2021-01-18 MED ORDER — IOHEXOL 300 MG/ML  SOLN
100.0000 mL | Freq: Once | INTRAMUSCULAR | Status: AC | PRN
  Administered 2021-01-18: 100 mL via INTRAVENOUS

## 2021-01-18 MED ORDER — SODIUM CHLORIDE 0.9 % IV BOLUS
1000.0000 mL | Freq: Once | INTRAVENOUS | Status: AC
  Administered 2021-01-18: 1000 mL via INTRAVENOUS

## 2021-01-18 NOTE — ED Provider Notes (Signed)
MSE was initiated and I personally evaluated the patient and placed orders (if any) at  8:25 PM on January 18, 2021.  The patient appears stable so that the remainder of the MSE may be completed by another provider.   Chief Complaint: Abdominal pain   HPI:  Patient complaining of right lower quadrant pain that began 45 minutes ago.  Patient states over the past couple of days she has had low appetite, has not been hungry and then the right lower quadrant pain started today.  No pelvic pain or vaginal bleeding.  Denies any nausea, vomiting or fevers.  No diarrhea.    ROS:  Abdominal pain   Physical Exam:  Gen:                 Patient is awake and alert, hunched over in pain. HEENT:          Atraumatic  Resp:              Normal effort  Cardiac:          Normal rate  Abd:                 Umbilical and right lower quadrant area tender, no guarding. MSK:              Moves extremities without difficulty  Neuro:            Speech clear,EOMS intact      Initiation of care has begun. The patient has been counseled on the process, plan, and necessity for staying for the completion/evaluation, and the remainder of the medical screening examination    Farrel Gordon, Cordelia Poche 01/18/21 2026    Mancel Bale, MD 01/19/21 0001

## 2021-01-18 NOTE — ED Provider Notes (Incomplete)
[facility]-EMERGENCY DEPT Provider Note   CSN: [id number] Arrival date & time: 01/18/21  1937     History Chief Complaint  Patient presents with  . Abdominal Pain    Carla Stevens is a 20 y.o. female without significant past medical hx who presents to the ED with complaints of abdominal pain that began 1 hour prior to ED arrival. Patient reports that pain began periumbilically then moved to the RLQ, constant since onset, steady, no alleviating/aggravating factors. Reports urinary frequency x 1 week. Currently feels hungry. Denies fever, chills, vomiting, diarrhea, melena, or dysuria. She just finished her period yesterday. Denies prior abdominal surgeries.  She is currently sexually active in a monogamous relationship and denies concern for STD.  HPI     History reviewed. No pertinent past medical history.  There are no problems to display for this patient.   History reviewed. No pertinent surgical history.   OB History   No obstetric history on file.     Family History  Family history unknown: Yes    Social History   Tobacco Use  . Smoking status: Passive Smoke Exposure - Never Smoker  . Smokeless tobacco: Never Used  Vaping Use  . Vaping Use: Never used  Substance Use Topics  . Alcohol use: No  . Drug use: No    Home Medications Prior to Admission medications   Medication Sig Start Date End Date Taking? Authorizing Provider  aspirin-sod bicarb-citric acid (ALKA-SELTZER) 325 MG TBEF tablet Take 650 mg by mouth once.    [provider]  ibuprofen (ADVIL,MOTRIN) 400 MG tablet Take 1 tablet (400 mg total) by mouth every 6 (six) hours as needed. 05/20/16   Ronnell Freshwater, NP  Burr Medico 150-35 MCG/24HR transdermal patch Place 1 patch onto the skin once a week. 08/25/19 10/03/20  [provider]    Allergies    Watermelon [citrullus vulgaris]  Review of Systems   Review of Systems  Constitutional: Negative for chills  and fever.  Respiratory: Negative for shortness of breath.   Cardiovascular: Negative for chest pain.  Gastrointestinal: Positive for abdominal pain. Negative for blood in stool, constipation, diarrhea and vomiting.  Genitourinary: Positive for frequency. Negative for dysuria and vaginal bleeding (none at present, just finished menses).  Neurological: Negative for syncope.  All other systems reviewed and are negative.   Physical Exam Updated Vital Signs BP 117/73 (BP Location: Left Arm)   Pulse 69   Temp 98.4 F (36.9 C) (Oral)   Resp 18   Ht 5\' 3"  (1.6 m)   Wt 52.2 kg   SpO2 100%   BMI 20.37 kg/m   Physical Exam Vitals and nursing note reviewed.  Constitutional:      General: She is not in acute distress.    Appearance: She is well-developed. She is not toxic-appearing.  HENT:     Head: Normocephalic and atraumatic.  Eyes:     General:        Right eye: No discharge.        Left eye: No discharge.     Conjunctiva/sclera: Conjunctivae normal.  Cardiovascular:     Rate and Rhythm: Normal rate and regular rhythm.  Pulmonary:     Effort: Pulmonary effort is normal. No respiratory distress.     Breath sounds: Normal breath sounds. No wheezing, rhonchi or rales.  Abdominal:     General: There is no distension.     Palpations: Abdomen is soft.     Tenderness: There is  abdominal tenderness in the right lower quadrant. There is no guarding or rebound.  Genitourinary:    Comments: Patient declined.  Musculoskeletal:     Cervical back: Neck supple.     Comments: Patient's left foot is in a postop type shoe, she states that she broke her left fifth toe.   Skin:    General: Skin is warm and dry.     Findings: No rash.  Neurological:     Mental Status: She is alert.     Comments: Clear speech.   Psychiatric:        Behavior: Behavior normal.     ED Results / Procedures / Treatments   Labs (all labs ordered are listed, but only abnormal results are displayed) Labs  Reviewed  CBC WITH DIFFERENTIAL/PLATELET - Abnormal; Notable for the following components:      Result Value   WBC 12.5 (*)    Lymphs Abs 4.4 (*)    Monocytes Absolute 1.1 (*)    All other components within normal limits  COMPREHENSIVE METABOLIC PANEL - Abnormal; Notable for the following components:   Calcium 10.7 (*)    Total Protein 8.4 (*)    All other components within normal limits  URINALYSIS, ROUTINE W REFLEX MICROSCOPIC - Abnormal; Notable for the following components:   APPearance HAZY (*)    Hgb urine dipstick SMALL (*)    Ketones, ur 5 (*)    Leukocytes,Ua TRACE (*)    Bacteria, UA RARE (*)    All other components within normal limits  LIPASE, BLOOD  HCG, QUANTITATIVE, PREGNANCY  I-STAT BETA HCG BLOOD, ED ([facility], [facility], [facility] ONLY)    EKG None  Radiology CT Abdomen Pelvis W Contrast  Result Date: 01/18/2021 CLINICAL DATA:  Worsening right-sided abdominal pain EXAM: CT ABDOMEN AND PELVIS WITH CONTRAST TECHNIQUE: Multidetector CT imaging of the abdomen and pelvis was performed using the standard protocol following bolus administration of intravenous contrast. CONTRAST:  OMNIPAQUE IOHEXOL 300 MG/ML  SOLN COMPARISON:  None. FINDINGS: Lower chest: Unremarkable. Normal size heart. No significant pericardial effusion/thickening. Hepatobiliary: No suspicious hepatic lesion. Gallbladder is unremarkable. No biliary ductal dilation. Pancreas: Unremarkable. No pancreatic ductal dilatation or surrounding inflammatory changes. Spleen: Normal in size without focal abnormality. Adrenals/Urinary Tract: Adrenal glands are unremarkable. Kidneys are normal, without renal calculi, focal lesion, or hydronephrosis. Bladder is unremarkable. Stomach/Bowel: Stomach is within normal limits. Appendix appears normal. No evidence of bowel wall thickening, distention, or inflammatory changes. Vascular/Lymphatic: No significant vascular findings are present. No enlarged abdominal or pelvic lymph nodes.  Reproductive: Uterus and bilateral adnexa are unremarkable. Other: Trace pelvic free fluid, likely physiologic. Musculoskeletal: No acute or significant osseous findings. IMPRESSION: 1. No acute abdominopelvic findings. Normal appendix. 2. Trace pelvic free fluid, likely physiologic. Electronically Signed   By: Maudry Mayhew MD   On: 01/18/2021 22:55    Procedures Procedures {Remember to document critical care time when appropriate:1}  Medications Ordered in ED Medications - No data to display  ED Course  I have reviewed the triage vital signs and the nursing notes.  Pertinent labs & imaging results that were available during my care of the patient were reviewed by me and considered in my medical decision making (see chart for details).    MDM Rules/Calculators/A&P                          Patient presents to the ED with complaints of abdominal pain. Patient nontoxic appearing, in  no apparent distress, vitals WNL. On exam patient tender to the right lower quadrant of the abdomen, no suprapubic tenderness or CVA tenderness, no peritoneal signs. Will evaluate with  Additional history obtained:  Additional history obtained from chart review & nursing note review.   Lab Tests:  I reviewed and interpreted labs, which included:  CBC: Mild leukocytosis.  CMP: Hypercalcemia Lipase: WNL UA: Rare bacteria and trace leukocytes, WBCs present, nitrite negative- sent for culture with her urinary frequency Preg test: Negative.   Imaging Studies ordered:  CT A/P ordered by triage APP, I independently reviewed, formal radiology impression shows:  1. No acute abdominopelvic findings. Normal appendix. 2. Trace pelvic free fluid, likely physiologic  ED Course:  CT A/P without acute process, appendix is normal- no findings of appendicitis. On CT adnexa unremarkable without findings to suggest ovarian torsion or findings of ovarian mass. Patient without complaints of atypical vaginal discharge, she  reports being in a monogamous relationship without concern for STI, declining pelvic exam, suspicion for PID is low at this time. Given fluids for mild hypercalcemia- will need PCP recheck. On re-assessment. ***   On repeat abdominal exam patient remains without peritoneal signs, low suspicion for cholecystitis, pancreatitis, diverticulitis, appendicitis, bowel obstruction/perforation, *** PID, ectopic pregnancy, or other acute surgical process. Patient tolerating PO in the emergency department. Will discharge home with supportive measures. I discussed results, treatment plan, need for PCP follow-up, and return precautions with the patient. Provided opportunity for questions, patient confirmed understanding and is in agreement with plan.    Portions of this note were generated with Scientist, clinical (histocompatibility and immunogenetics). Dictation errors may occur despite best attempts at proofreading.    Final Clinical Impression(s) / ED Diagnoses Final diagnoses:  None    Rx / DC Orders ED Discharge Orders    None

## 2021-01-18 NOTE — ED Triage Notes (Signed)
Pt reports sudden onset RLQ within the last hour.

## 2021-01-18 NOTE — ED Provider Notes (Signed)
[facility]-EMERGENCY DEPT Provider Note   CSN: [id number] Arrival date & time: 01/18/21  1937     History Chief Complaint  Patient presents with  . Abdominal Pain    Carla Stevens is a 20 y.o. female without significant past medical hx who presents to the ED with complaints of abdominal pain that began 1 hour prior to ED arrival. Patient reports that pain began periumbilically then moved to the RLQ, constant since onset, steady, no alleviating/aggravating factors. Reports urinary frequency x 1 week. Currently feels hungry. Denies fever, chills, vomiting, diarrhea, melena, or dysuria. She just finished her period yesterday. Denies prior abdominal surgeries.  She is currently sexually active in a monogamous relationship and denies concern for STD.  HPI     History reviewed. No pertinent past medical history.  There are no problems to display for this patient.   History reviewed. No pertinent surgical history.   OB History   No obstetric history on file.     Family History  Family history unknown: Yes    Social History   Tobacco Use  . Smoking status: Passive Smoke Exposure - Never Smoker  . Smokeless tobacco: Never Used  Vaping Use  . Vaping Use: Never used  Substance Use Topics  . Alcohol use: No  . Drug use: No    Home Medications Prior to Admission medications   Medication Sig Start Date End Date Taking? Authorizing Provider  aspirin-sod bicarb-citric acid (ALKA-SELTZER) 325 MG TBEF tablet Take 650 mg by mouth once.    [provider]  ibuprofen (ADVIL,MOTRIN) 400 MG tablet Take 1 tablet (400 mg total) by mouth every 6 (six) hours as needed. 05/20/16   Ronnell Freshwater, NP  Burr Medico 150-35 MCG/24HR transdermal patch Place 1 patch onto the skin once a week. 08/25/19 10/03/20  [provider]    Allergies    Watermelon [citrullus vulgaris]  Review of Systems   Review of Systems  Constitutional: Negative for chills  and fever.  Respiratory: Negative for shortness of breath.   Cardiovascular: Negative for chest pain.  Gastrointestinal: Positive for abdominal pain. Negative for blood in stool, constipation, diarrhea and vomiting.  Genitourinary: Positive for frequency. Negative for dysuria and vaginal bleeding (none at present, just finished menses).  Neurological: Negative for syncope.  All other systems reviewed and are negative.   Physical Exam Updated Vital Signs BP 117/73 (BP Location: Left Arm)   Pulse 69   Temp 98.4 F (36.9 C) (Oral)   Resp 18   Ht 5\' 3"  (1.6 m)   Wt 52.2 kg   SpO2 100%   BMI 20.37 kg/m   Physical Exam Vitals and nursing note reviewed.  Constitutional:      General: She is not in acute distress.    Appearance: She is well-developed. She is not toxic-appearing.  HENT:     Head: Normocephalic and atraumatic.  Eyes:     General:        Right eye: No discharge.        Left eye: No discharge.     Conjunctiva/sclera: Conjunctivae normal.  Cardiovascular:     Rate and Rhythm: Normal rate and regular rhythm.  Pulmonary:     Effort: Pulmonary effort is normal. No respiratory distress.     Breath sounds: Normal breath sounds. No wheezing, rhonchi or rales.  Abdominal:     General: There is no distension.     Palpations: Abdomen is soft.     Tenderness: There is  abdominal tenderness in the right lower quadrant. There is no guarding or rebound.  Genitourinary:    Comments: Patient declined.  Musculoskeletal:     Cervical back: Neck supple.     Comments: Patient's left foot is in a postop type shoe, she states that she broke her left fifth toe.   Skin:    General: Skin is warm and dry.     Findings: No rash.  Neurological:     Mental Status: She is alert.     Comments: Clear speech.   Psychiatric:        Behavior: Behavior normal.     ED Results / Procedures / Treatments   Labs (all labs ordered are listed, but only abnormal results are displayed) Labs  Reviewed  CBC WITH DIFFERENTIAL/PLATELET - Abnormal; Notable for the following components:      Result Value   WBC 12.5 (*)    Lymphs Abs 4.4 (*)    Monocytes Absolute 1.1 (*)    All other components within normal limits  COMPREHENSIVE METABOLIC PANEL - Abnormal; Notable for the following components:   Calcium 10.7 (*)    Total Protein 8.4 (*)    All other components within normal limits  URINALYSIS, ROUTINE W REFLEX MICROSCOPIC - Abnormal; Notable for the following components:   APPearance HAZY (*)    Hgb urine dipstick SMALL (*)    Ketones, ur 5 (*)    Leukocytes,Ua TRACE (*)    Bacteria, UA RARE (*)    All other components within normal limits  LIPASE, BLOOD  HCG, QUANTITATIVE, PREGNANCY  I-STAT BETA HCG BLOOD, ED ([facility], [facility], [facility] ONLY)    EKG None  Radiology CT Abdomen Pelvis W Contrast  Result Date: 01/18/2021 CLINICAL DATA:  Worsening right-sided abdominal pain EXAM: CT ABDOMEN AND PELVIS WITH CONTRAST TECHNIQUE: Multidetector CT imaging of the abdomen and pelvis was performed using the standard protocol following bolus administration of intravenous contrast. CONTRAST:  OMNIPAQUE IOHEXOL 300 MG/ML  SOLN COMPARISON:  None. FINDINGS: Lower chest: Unremarkable. Normal size heart. No significant pericardial effusion/thickening. Hepatobiliary: No suspicious hepatic lesion. Gallbladder is unremarkable. No biliary ductal dilation. Pancreas: Unremarkable. No pancreatic ductal dilatation or surrounding inflammatory changes. Spleen: Normal in size without focal abnormality. Adrenals/Urinary Tract: Adrenal glands are unremarkable. Kidneys are normal, without renal calculi, focal lesion, or hydronephrosis. Bladder is unremarkable. Stomach/Bowel: Stomach is within normal limits. Appendix appears normal. No evidence of bowel wall thickening, distention, or inflammatory changes. Vascular/Lymphatic: No significant vascular findings are present. No enlarged abdominal or pelvic lymph nodes.  Reproductive: Uterus and bilateral adnexa are unremarkable. Other: Trace pelvic free fluid, likely physiologic. Musculoskeletal: No acute or significant osseous findings. IMPRESSION: 1. No acute abdominopelvic findings. Normal appendix. 2. Trace pelvic free fluid, likely physiologic. Electronically Signed   By: Maudry Mayhew MD   On: 01/18/2021 22:55    Procedures Procedures   Medications Ordered in ED Medications - No data to display  ED Course  I have reviewed the triage vital signs and the nursing notes.  Pertinent labs & imaging results that were available during my care of the patient were reviewed by me and considered in my medical decision making (see chart for details).    MDM Rules/Calculators/A&P                          Patient presents to the ED with complaints of abdominal pain. Patient nontoxic appearing, in no apparent distress, vitals WNL. On exam  patient tender to the right lower quadrant of the abdomen, no suprapubic tenderness or CVA tenderness, no peritoneal signs. Will evaluate with  Additional history obtained:  Additional history obtained from chart review & nursing note review.   Lab Tests:  I reviewed and interpreted labs, which included:  CBC: Mild leukocytosis.  CMP: Hypercalcemia Lipase: WNL UA: Rare bacteria and trace leukocytes, WBCs present, nitrite negative- sent for culture with her urinary frequency Preg test: Negative.   Imaging Studies ordered:  CT A/P ordered by triage APP, I independently reviewed, formal radiology impression shows:  1. No acute abdominopelvic findings. Normal appendix. 2. Trace pelvic free fluid, likely physiologic  ED Course:  CT A/P without acute process, appendix is normal- no findings of appendicitis. On CT adnexa unremarkable without findings to suggest ovarian torsion or findings of ovarian mass. Patient without complaints of atypical vaginal discharge, she reports being in a monogamous relationship without concern for  STI, declining pelvic exam on initial and subsequent conversation during ED visit, suspicion for PID is low at this time. Given fluids for mild hypercalcemia- will need PCP recheck. On re-assessment patient is resting comfortably, pain much better, tolerating PO. Repeat abdominal exam without peritoneal signs, low suspicion for acute surigcal process. Given urinary sxs with some leuks/bacteria will cover for UTI, culture sent. In terms of abx she would prefer something she can receive here and not have to pick up at the pharmacy, given this factor will give fosfomycin. Will also provide naproxen prescription for as needed pain.  I discussed results, treatment plan, need for PCP follow-up, and return precautions with the patient. Provided opportunity for questions, patient confirmed understanding and is in agreement with plan.   Portions of this note were generated with Scientist, clinical (histocompatibility and immunogenetics). Dictation errors may occur despite best attempts at proofreading.  Final Clinical Impression(s) / ED Diagnoses Final diagnoses:  Abdominal pain, unspecified abdominal location    Rx / DC Orders ED Discharge Orders         Ordered    naproxen (NAPROSYN) 375 MG tablet  2 times daily PRN        01/19/21 0019           Madyson Lukach, Pleas Koch, PA-C 01/19/21 0023    Zadie Rhine, MD 01/19/21 [phone]

## 2021-01-19 MED ORDER — FOSFOMYCIN TROMETHAMINE 3 G PO PACK
3.0000 g | PACK | Freq: Once | ORAL | Status: AC
  Administered 2021-01-19: 3 g via ORAL
  Filled 2021-01-19: qty 3

## 2021-01-19 MED ORDER — NAPROXEN 375 MG PO TABS: 375.0000 mg | ORAL_TABLET | Freq: Two times a day (BID) | ORAL | 0 refills | Status: DC | PRN

## 2021-01-19 NOTE — Discharge Instructions (Signed)
You were seen in the emergency department today for abdominal pain.  Your work-up in the ER was overall reassuring.  Your blood work did show that your calcium level was elevated, please have this rechecked by your primary care provider within 3 days, in the meantime please avoid calcium containing food/drink.  Your CT scan did not show any significant acute abnormalities.  There were no findings to suggest appendicitis.  Your urine did show some findings of infection therefore you were given a dose of antibiotics in the emergency department, your urine was sent for culture, we will call you if you need additional antibiotics.  We are sending you home with naproxen to take as needed for pain. - Naproxen is a nonsteroidal anti-inflammatory medication that will help with pain and swelling. Be sure to take this medication as prescribed with food, 1 pill every 12 hours,  It should be taken with food, as it can cause stomach upset, and more seriously, stomach bleeding. Do not take other nonsteroidal anti-inflammatory medications with this such as Advil, Motrin, Aleve, Mobic, Goodie Powder, or Motrin.    You make take Tylenol per over the counter dosing with these medications.   We have prescribed you new medication(s) today. Discuss the medications prescribed today with your pharmacist as they can have adverse effects and interactions with your other medicines including over the counter and prescribed medications. Seek medical evaluation if you start to experience new or abnormal symptoms after taking one of these medicines, seek care immediately if you start to experience difficulty breathing, feeling of your throat closing, facial swelling, or rash as these could be indications of a more serious allergic reaction  Please follow-up with primary care within 3 days.  Return to the ER for any new or worsening symptoms you have including but not limited to new or worsening pain, fever, inability to keep fluids  down, blood in vomit or stool, passing out, chest pain, trouble breathing, flank/back pain, new vaginal discharge, or any other concerns.

## 2021-01-21 LAB — URINE CULTURE: Culture: >=100000 — AB

## 2021-01-22 ENCOUNTER — Telehealth: Payer: Self-pay | Admitting: Emergency Medicine

## 2021-01-22 NOTE — Telephone Encounter (Signed)
Post ED Visit - Positive Culture Follow-up  Culture report reviewed by antimicrobial stewardship pharmacist: [facility] []  , Pharm.D. []  Enzo Bi, Pharm.D., BCPS AQ-ID []  , Pharm.D., BCPS []  Celedonio Miyamoto, Pharm.D., BCPS []  Plummer, Garvin Fila.D., BCPS, AAHIVP []  , Pharm.D., BCPS, AAHIVP []  Georgina Pillion, PharmD, BCPS []  , PharmD, BCPS []  [place], PharmD, BCPS []  [address], PharmD []  , PharmD, BCPS []  Estella Husk, PharmD  Pharmacy Team []  Lysle Pearl, PharmD []  , PharmD []  Phillips Climes, PharmD []  , Rph []  Agapito Games) , PharmD []  Verlan Friends, PharmD []  , PharmD []  Mervyn Gay, PharmD []  , PharmD []  Vinnie Level, PharmD []  [facility], PharmD []  , PharmD []  Len Childs, PharmD   Positive urine culture Treated with fosfomycin, organism sensitive to the same and no further patient follow-up is required at this time.  01/22/2021, 12:43 PM

## 2021-03-12 ENCOUNTER — Inpatient Hospital Stay (HOSPITAL_COMMUNITY)
Admission: EM | Admit: 2021-03-12 | Discharge: 2021-03-12 | Disposition: A | Discharge status: Home / Self Care | Payer: Medicaid Other | Attending: Obstetrics & Gynecology | Admitting: Obstetrics & Gynecology

## 2021-03-12 ENCOUNTER — Inpatient Hospital Stay (HOSPITAL_COMMUNITY): Payer: Medicaid Other

## 2021-03-12 ENCOUNTER — Encounter (HOSPITAL_COMMUNITY): Payer: Self-pay | Admitting: *Deleted

## 2021-03-12 ENCOUNTER — Other Ambulatory Visit: Payer: Self-pay

## 2021-03-12 DIAGNOSIS — Z7722 Contact with and (suspected) exposure to environmental tobacco smoke (acute) (chronic): Secondary | ICD-10-CM | POA: Insufficient documentation

## 2021-03-12 DIAGNOSIS — Z3A01 Less than 8 weeks gestation of pregnancy: Secondary | ICD-10-CM | POA: Insufficient documentation

## 2021-03-12 DIAGNOSIS — R109 Unspecified abdominal pain: Secondary | ICD-10-CM | POA: Diagnosis not present

## 2021-03-12 DIAGNOSIS — O3680X0 Pregnancy with inconclusive fetal viability, not applicable or unspecified: Secondary | ICD-10-CM | POA: Diagnosis not present

## 2021-03-12 DIAGNOSIS — O26891 Other specified pregnancy related conditions, first trimester: Secondary | ICD-10-CM | POA: Diagnosis not present

## 2021-03-12 DIAGNOSIS — R110 Nausea: Secondary | ICD-10-CM

## 2021-03-12 DIAGNOSIS — Z3201 Encounter for pregnancy test, result positive: Secondary | ICD-10-CM | POA: Diagnosis not present

## 2021-03-12 LAB — WET PREP, GENITAL
Sperm: NONE SEEN
Trich, Wet Prep: NONE SEEN
Yeast Wet Prep HPF POC: NONE SEEN

## 2021-03-12 LAB — CBC WITH DIFFERENTIAL/PLATELET
Abs Immature Granulocytes: 0.02 10*3/uL (ref 0.00–0.07)
Basophils Absolute: 0 10*3/uL (ref 0.0–0.1)
Basophils Relative: 0 %
Eosinophils Absolute: 0.1 10*3/uL (ref 0.0–0.5)
Eosinophils Relative: 2 %
HCT: 42.5 % (ref 36.0–46.0)
Hemoglobin: 13.5 g/dL (ref 12.0–15.0)
Immature Granulocytes: 0 %
Lymphocytes Relative: 41 %
Lymphs Abs: 3.3 10*3/uL (ref 0.7–4.0)
MCH: 27.3 pg (ref 26.0–34.0)
MCHC: 31.8 g/dL (ref 30.0–36.0)
MCV: 86 fL (ref 80.0–100.0)
Monocytes Absolute: 0.6 10*3/uL (ref 0.1–1.0)
Monocytes Relative: 7 %
Neutro Abs: 4.1 10*3/uL (ref 1.7–7.7)
Neutrophils Relative %: 50 %
Platelets: 355 10*3/uL (ref 150–400)
RBC: 4.94 MIL/uL (ref 3.87–5.11)
RDW: 13.6 % (ref 11.5–15.5)
WBC: 8.2 10*3/uL (ref 4.0–10.5)
nRBC: 0 % (ref 0.0–0.2)

## 2021-03-12 LAB — COMPREHENSIVE METABOLIC PANEL
ALT: 16 U/L (ref 0–44)
AST: 26 U/L (ref 15–41)
Albumin: 4.1 g/dL (ref 3.5–5.0)
Alkaline Phosphatase: 46 U/L (ref 38–126)
Anion gap: 10 (ref 5–15)
BUN: 7 mg/dL (ref 6–20)
CO2: 22 mmol/L (ref 22–32)
Calcium: 9.3 mg/dL (ref 8.9–10.3)
Chloride: 104 mmol/L (ref 98–111)
Creatinine, Ser: 0.67 mg/dL (ref 0.44–1.00)
GFR, Estimated: >60 mL/min (ref >60)
Glucose, Bld: 91 mg/dL (ref 70–99)
Potassium: 3.6 mmol/L (ref 3.5–5.1)
Sodium: 136 mmol/L (ref 135–145)
Total Bilirubin: 0.9 mg/dL (ref 0.3–1.2)
Total Protein: 7.7 g/dL (ref 6.5–8.1)

## 2021-03-12 LAB — LIPASE, BLOOD: Lipase: 63 U/L — ABNORMAL HIGH (ref 11–51)

## 2021-03-12 LAB — URINALYSIS, ROUTINE W REFLEX MICROSCOPIC
Bilirubin Urine: NEGATIVE
Glucose, UA: NEGATIVE mg/dL
Hgb urine dipstick: NEGATIVE
Ketones, ur: 20 mg/dL — AB
Leukocytes,Ua: NEGATIVE
Nitrite: NEGATIVE
Protein, ur: NEGATIVE mg/dL
Specific Gravity, Urine: 1.02 (ref 1.005–1.030)
pH: 5 (ref 5.0–8.0)

## 2021-03-12 LAB — I-STAT BETA HCG BLOOD, ED (MC, WL, AP ONLY): I-stat hCG, quantitative: 992.3 m[IU]/mL — ABNORMAL HIGH (ref <5)

## 2021-03-12 LAB — HCG, QUANTITATIVE, PREGNANCY: hCG, Beta Chain, Quant, S: 1215 m[IU]/mL — ABNORMAL HIGH (ref <5)

## 2021-03-12 NOTE — MAU Note (Signed)
Carla Stevens is a 20 y.o. at [redacted]w[redacted]d here in MAU reporting: started having abdominal pain, states it is intermittent. Found out she was pregnant while in the ED. Denies bleeding or discharge.  LMP: 02/11/21  Onset of complaint: last night  Pain score: 7/10  Vitals:   03/12/21 1414 03/12/21 1727  BP: 111/61 106/64  Pulse: (!) 56 (!) 57  Resp: 16 16  Temp:  97.9 F (36.6 C)  SpO2: 100% 99%     Lab orders placed from triage: none

## 2021-03-12 NOTE — Discharge Instructions (Signed)
Return to care  If you have heavier bleeding that soaks through more that 2 pads per hour for an hour or more If you bleed so much that you feel like you might pass out or you do pass out If you have significant abdominal pain that is not improved with Tylenol   

## 2021-03-12 NOTE — ED Provider Notes (Signed)
Emergency Medicine Provider OB Triage Evaluation Note  Carla Stevens is a 20 y.o. female, No obstetric history on file., at Unknown gestation who presents to the emergency department with compl aints of lower abdominal pain and nausea.  She notes that her last menstrual cycle was 5/22 however that cycle was significantly shorter than usual for her.  She has not been on any birth control.  She is sexually active. She did not know she was pregnant prior to arrival.  This is her first pregnancy.  Review of  Systems  Positive: Lower abdominal pain/cramping Negative: dysuria, does have frequency  Physical Exam  BP 111/61 (BP Location: Left Arm)   Pulse (!) 56   Temp 98.4 F (36.9 C) (Oral)   Resp 16   LMP 02/11/2021   SpO2 100%  General: Awake, no distress  HEENT: Atraumatic  Resp: Normal effort  Cardiac: Normal rate Abd: Nondistended, nontender mild LLQ abdominal tenderness MSK moves all extremities without difficulty Neuro: Speech clear  Medical Decision Making  Pt evaluated for pregnancy concern and is stable for transfer to MAU. Pt is in agreement with plan for transfer.  4:38 PM Discussed with MAU APP, Sam, who accepts patient in transfer.  Clinical Impression   1. Nausea   2. Positive pregnancy test        Norman Clay 03/12/21 1638    Mancel Bale, MD 03/12/21 [phone]

## 2021-03-12 NOTE — ED Provider Notes (Signed)
Emergency Medicine Provider Triage Evaluation Note  Haydon Kalmar , a 20 y.o. female  was evaluated in triage.  Pt complains of recent UTI.  She now has nausea and mild lower left-sided abdominal pain.  She states that her cycle was supposed to come on a few days ago however has not started it yet.  She does have some occasional cramping which is normal for her prior to the onset of her menstrual cycle.  No fevers.  She did have increased discharge a few days ago however that has resolved.  She is sexually active..  Review of Systems  Positive: LLQ abdominal pain , urinary frequency  Negative: fevers  Physical Exam  BP 111/61 (BP Location: Left Arm)   Pulse (!) 56   Temp 98.4 F (36.9 C) (Oral)   Resp 16   LMP 02/11/2021   SpO2 100%  Gen:   Awake, no distress   Resp:  Normal effort  MSK:   Moves extremities without difficulty  Other:  LLQ abdominal TTP.   Medical Decision Making  Medically screening exam initiated at 2:28 PM.  Appropriate orders placed.  Alyxandria Wentz was informed that the remainder of the evaluation will be completed by another provider, this initial triage assessment does not replace that evaluation, and the importance of remaining in the ED until their evaluation is complete.     Cristina Gong, PA-C 03/12/21 1429    Arby Barrette, MD 03/21/21 [phone]

## 2021-03-12 NOTE — MAU Provider Note (Signed)
History     CSN: [id number]  Arrival date and time: 03/12/21 1125   None     Chief Complaint  Patient presents with   Abdominal Pain   HPI Carla Stevens is a 20 y.o. G1P0 at [redacted]w[redacted]d who presents with abdominal pain & nausea. Symptoms started Thursday. She was supposed to start her period on Saturday but didn't. Had a positive pregnancy test in the ER earlier today.  Reports intermittent pinching pains in her LLQ. Has had some nausea but no vomiting. Denies fever, diarrhea, dysuria, vaginal bleeding, or vaginal discharge.   OB History     Gravida  1   Para      Term      Preterm      AB      Living         SAB      IAB      Ectopic      Multiple      Live Births              History reviewed. No pertinent past medical history.  History reviewed. No pertinent surgical history.  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    Review of Systems  Constitutional: Negative.   Gastrointestinal:  Positive for abdominal pain and nausea. Negative for constipation, diarrhea and vomiting.  Genitourinary: Negative.   Physical Exam   Blood pressure 106/64, pulse (!) 57, temperature 97.9 F (36.6 C), temperature source Oral, resp. rate 16, weight 51.5 kg, last menstrual period 02/11/2021, SpO2 99 %.  Physical Exam Vitals and nursing note reviewed.  Constitutional:      General: She is not in acute distress.    Appearance: She is well-developed and normal weight.  HENT:     Head: Normocephalic and atraumatic.  Eyes:     General: No scleral icterus. Pulmonary:     Effort: Pulmonary effort is normal. No respiratory  distress.  Abdominal:     General: Abdomen is flat. There is no distension.     Palpations: Abdomen is soft.     Tenderness: There is no abdominal tenderness.  Skin:    General: Skin is warm and dry.  Neurological:     Mental Status: She is alert.  Psychiatric:        Mood and Affect: Mood normal.        Behavior: Behavior normal.    MAU Course  Procedures Results for orders placed or performed during the hospital encounter of 03/12/21 (from the past 24 hour(s))  Urinalysis, Routine w reflex microscopic Urine, Clean Catch     Status: Abnormal   Collection Time: 03/12/21  2:28 PM  Result Value Ref Range   Color, Urine YELLOW YELLOW   APPearance HAZY (A) CLEAR   Specific Gravity, Urine 1.020 1.005 - 1.030   pH 5.0 5.0 - 8.0   Glucose, UA NEGATIVE NEGATIVE mg/dL   Hgb urine dipstick NEGATIVE NEGATIVE   Bilirubin Urine NEGATIVE NEGATIVE   Ketones, ur 20 (A) NEGATIVE mg/dL   Protein, ur NEGATIVE NEGATIVE mg/dL   Nitrite NEGATIVE NEGATIVE  Leukocytes,Ua NEGATIVE NEGATIVE  CBC with Differential     Status: None   Collection Time: 03/12/21  2:28 PM  Result Value Ref Range   WBC 8.2 4.0 - 10.5 K/uL   RBC 4.94 3.87 - 5.11 MIL/uL   Hemoglobin 13.5 12.0 - 15.0 g/dL   HCT 22.2 97.9 - 89.2 %   MCV 86.0 80.0 - 100.0 fL   MCH 27.3 26.0 - 34.0 pg   MCHC 31.8 30.0 - 36.0 g/dL   RDW 11.9 41.7 - 40.8 %   Platelets 355 150 - 400 K/uL   nRBC 0.0 0.0 - 0.2 %   Neutrophils Relative % 50 %   Neutro Abs 4.1 1.7 - 7.7 K/uL   Lymphocytes Relative 41 %   Lymphs Abs 3.3 0.7 - 4.0 K/uL   Monocytes Relative 7 %   Monocytes Absolute 0.6 0.1 - 1.0 K/uL   Eosinophils Relative 2 %   Eosinophils Absolute 0.1 0.0 - 0.5 K/uL   Basophils Relative 0 %   Basophils Absolute 0.0 0.0 - 0.1 K/uL   Immature Granulocytes 0 %   Abs Immature Granulocytes 0.02 0.00 - 0.07 K/uL  Comprehensive metabolic panel     Status: None   Collection Time: 03/12/21  2:28 PM  Result Value Ref Range   Sodium 136 135 - 145  mmol/L   Potassium 3.6 3.5 - 5.1 mmol/L   Chloride 104 98 - 111 mmol/L   CO2 22 22 - 32 mmol/L   Glucose, Bld 91 70 - 99 mg/dL   BUN 7 6 - 20 mg/dL   Creatinine, Ser 1.44 0.44 - 1.00 mg/dL   Calcium 9.3 8.9 - 81.8 mg/dL   Total Protein 7.7 6.5 - 8.1 g/dL   Albumin 4.1 3.5 - 5.0 g/dL   AST 26 15 - 41 U/L   ALT 16 0 - 44 U/L   Alkaline Phosphatase 46 38 - 126 U/L   Total Bilirubin 0.9 0.3 - 1.2 mg/dL   GFR, Estimated >56 >31 mL/min   Anion gap 10 5 - 15  Lipase, blood     Status: Abnormal   Collection Time: 03/12/21  2:28 PM  Result Value Ref Range   Lipase 63 (H) 11 - 51 U/L  I-Stat beta hCG blood, ED     Status: Abnormal   Collection Time: 03/12/21  2:40 PM  Result Value Ref Range   I-stat hCG, quantitative 992.3 (H) <5 mIU/mL   Comment 3          hCG, quantitative, pregnancy     Status: Abnormal   Collection Time: 03/12/21  2:51 PM  Result Value Ref Range   hCG, Beta Chain, Quant, S 1,215 (H) <5 mIU/mL  Wet prep, genital     Status: Abnormal   Collection Time: 03/12/21  6:42 PM  Result Value Ref Range   Yeast Wet Prep HPF POC NONE SEEN NONE SEEN   Trich, Wet Prep NONE SEEN NONE SEEN   Clue Cells Wet Prep HPF POC PRESENT (A) NONE SEEN   WBC, Wet Prep HPF POC MODERATE (A) NONE SEEN   Sperm NONE SEEN    US OB LESS THAN 14 WEEKS WITH OB TRANSVAGINAL  Result Date: 03/12/2021 CLINICAL DATA:  Pelvic pain, positive pregnancy test EXAM: OBSTETRIC <14 WK [place] AND TRANSVAGINAL OB US TECHNIQUE: Both transabdominal and transvaginal ultrasound examinations were performed for complete evaluation of the gestation as well as the maternal uterus, adnexal regions, and pelvic cul-de-sac. Transvaginal technique was performed to  assess early pregnancy. COMPARISON:  None. FINDINGS: Intrauterine gestational sac: Tiny gestational sac is noted. Yolk sac:  Absent Embryo:  Absent MSD: 2.3 mm   4 w   6 d Subchorionic hemorrhage:  None visualized. Maternal uterus/adnexae: Ovaries are within normal limits.  No free fluid is noted. IMPRESSION: Probable early intrauterine gestational sac, but no yolk sac, fetal pole, or cardiac activity yet visualized. Recommend follow-up quantitative B-HCG levels and follow-up US in 14 days to assess viability. This recommendation follows SRU consensus guidelines: Diagnostic Criteria for Nonviable Pregnancy Early in the First Trimester. Malva Limes Med 2013; 505:6979-48. Electronically Signed   By: Alcide Clever M.D.   On: 03/12/2021 19:49    MDM +UPT UA, wet prep, GC/chlamydia, CBC, ABO/Rh, quant hCG, and [place] today to rule out ectopic pregnancy which can be life threatening.   Ultrasound shows ?IUGS but no yolk sac. No adnexal masses. HCG today is 1215. She has benign abdominal exam. Will have her return on Thursday for repeat HCG.   Assessment and Plan   1. Pregnancy of unknown anatomic location   2. Nausea   3. Positive pregnancy test   4. Abdominal pain during pregnancy in first trimester    -reviewed reasons to return to MAU, SAB vs ectopic precautions -scheduled for stat HCG in the office on Thursday morning  Judeth Horn 03/12/2021, 8:44 PM

## 2021-03-12 NOTE — ED Triage Notes (Signed)
Pt reports recent UTI. Now has nausea and mild lower abd discomfort. Reports feeling hungry, yet nauseated and bloated at same time. Lmp 5/22.

## 2021-03-13 LAB — GC/CHLAMYDIA PROBE AMP (~~LOC~~) NOT AT ARMC
Chlamydia: NEGATIVE
Comment: NEGATIVE
Comment: NORMAL
Neisseria Gonorrhea: NEGATIVE

## 2021-03-15 ENCOUNTER — Ambulatory Visit (INDEPENDENT_AMBULATORY_CARE_PROVIDER_SITE_OTHER): Payer: Medicaid Other

## 2021-03-15 ENCOUNTER — Other Ambulatory Visit: Payer: Self-pay

## 2021-03-15 VITALS — BP 86/71 | HR 69 | Ht 63.0 in | Wt 113.6 lb

## 2021-03-15 DIAGNOSIS — O26891 Other specified pregnancy related conditions, first trimester: Secondary | ICD-10-CM

## 2021-03-15 DIAGNOSIS — O3680X0 Pregnancy with inconclusive fetal viability, not applicable or unspecified: Secondary | ICD-10-CM

## 2021-03-15 LAB — BETA HCG QUANT (REF LAB): hCG Quant: 2187 m[IU]/mL

## 2021-03-15 NOTE — Progress Notes (Signed)
Results are back. Reviewed results with Dr Alvester Morin. Call placed to pt. Spoke with pt. Pt given results and recommendations per Dr Alvester Morin. Pt verbalized understanding. Pt would like to schedule OB US and is agreeable with serial lab draws on Monday and Wednesday.  Pt advised if has any vaginal bleeding or severe abd pain to go to MAU to be seen. Pt verbalized understanding.   OB US scheduled for 7/5 at 9 am. Pt sent Mychart of appt confirmation.   Judeth Cornfield, RN

## 2021-03-15 NOTE — Progress Notes (Signed)
Pt here today for STAT beta Hcg as follow up from MAU on 03/12/21. Pt states only having mild intermittent cramps in lower abd and denies any vaginal bleeding or spotting.   Pt advised will be called with results after reviewing with provider for plan of care. Pt verbalized understanding.   Judeth Cornfield, RN

## 2021-03-19 ENCOUNTER — Other Ambulatory Visit: Payer: Self-pay

## 2021-03-19 ENCOUNTER — Telehealth: Payer: Self-pay | Admitting: Lactation Services

## 2021-03-19 ENCOUNTER — Other Ambulatory Visit: Payer: Medicaid Other

## 2021-03-19 DIAGNOSIS — O3680X0 Pregnancy with inconclusive fetal viability, not applicable or unspecified: Secondary | ICD-10-CM

## 2021-03-19 NOTE — Telephone Encounter (Addendum)
Patient called and would like her blood results. Per chart review, blood was not ordered stat and should result after 24 hours.   Called patient to let her know that lab should results after 24 hours. Patient voiced understanding. Reviewed once results returned patient will be reached out to for results and recommendations.

## 2021-03-19 NOTE — Progress Notes (Signed)
Attestation of Attending Supervision of clinical support staff: I agree with the care provided to this patient and was available for any consultation.  I have reviewed the RN's note and chart. I was consulted and documentation reflects my recommendations.  Federico Flake, MD, MPH, ABFM Attending Physician Faculty Practice- [facility]

## 2021-03-20 LAB — BETA HCG QUANT (REF LAB): hCG Quant: 7718 m[IU]/mL

## 2021-03-21 ENCOUNTER — Other Ambulatory Visit: Payer: Medicaid Other

## 2021-03-21 ENCOUNTER — Other Ambulatory Visit: Payer: Self-pay

## 2021-03-21 DIAGNOSIS — O3680X0 Pregnancy with inconclusive fetal viability, not applicable or unspecified: Secondary | ICD-10-CM

## 2021-03-22 ENCOUNTER — Telehealth: Payer: Self-pay

## 2021-03-22 LAB — BETA HCG QUANT (REF LAB): hCG Quant: 17693 m[IU]/mL

## 2021-03-22 NOTE — Telephone Encounter (Addendum)
-----   Message from Carla Flake, MD sent at 03/22/2021  8:42 AM EDT ----- Rising bHCG. More than doubled and very reassuring for a normally progressing pregnancy  VM not set up. Per chart review, pt viewed Dr. [place] Blas message regarding results.

## 2021-03-27 ENCOUNTER — Other Ambulatory Visit: Payer: Self-pay

## 2021-03-27 ENCOUNTER — Telehealth: Payer: Self-pay | Admitting: Medical

## 2021-03-27 ENCOUNTER — Ambulatory Visit
Admission: RE | Admit: 2021-03-27 | Discharge: 2021-03-27 | Disposition: A | Discharge status: Home / Self Care | Payer: Medicaid Other | Source: Ambulatory Visit | Attending: Family Medicine | Admitting: Family Medicine

## 2021-03-27 DIAGNOSIS — Z3402 Encounter for supervision of normal first pregnancy, second trimester: Secondary | ICD-10-CM

## 2021-03-27 DIAGNOSIS — R109 Unspecified abdominal pain: Secondary | ICD-10-CM | POA: Insufficient documentation

## 2021-03-27 DIAGNOSIS — O26891 Other specified pregnancy related conditions, first trimester: Secondary | ICD-10-CM | POA: Diagnosis not present

## 2021-03-27 DIAGNOSIS — O3680X0 Pregnancy with inconclusive fetal viability, not applicable or unspecified: Secondary | ICD-10-CM | POA: Diagnosis present

## 2021-03-27 DIAGNOSIS — Z3401 Encounter for supervision of normal first pregnancy, first trimester: Secondary | ICD-10-CM

## 2021-03-27 HISTORY — DX: Encounter for supervision of normal first pregnancy, second trimester: Z34.02

## 2021-03-27 NOTE — Telephone Encounter (Signed)
-----   Message from Emeline Darling, RT sent at 03/27/2021  9:31 AM EDT ----- Regarding: [place] results We have just completed an outpatient ultrasound scheduled for a patient who was seen in MAU.  Please call the patient with the results.

## 2021-03-27 NOTE — Telephone Encounter (Signed)
I called Carla Stevens today at 10:51 AM and confirmed patient's identity using two patient identifiers. [place] results from earlier today were reviewed. Patient is not yet scheduled for new OB visit. Advised to choose a practice and make new OB appointment in 4-6 weeks. Encouraged to start prenatal vitamins. First trimester warning signs reviewed. Patient voiced understanding and had no further questions.   US OB Transvaginal  Result Date: 03/27/2021 CLINICAL DATA:  Pregnancy.  Growth and viability evaluation. EXAM: OBSTETRIC <14 WK [place] AND TRANSVAGINAL OB US TECHNIQUE: Both transabdominal and transvaginal ultrasound examinations were performed for complete evaluation of the gestation as well as the maternal uterus, adnexal regions, and pelvic cul-de-sac. Transvaginal technique was performed to assess early pregnancy. COMPARISON:  03/12/2021. FINDINGS: Intrauterine gestational sac: Single Yolk sac:  Present Embryo:  Present Cardiac Activity: Present Heart Rate: 120 bpm CRL: 4.9 mm   6 w   1 d                  [place] EDC: 11/19/2021 Subchorionic hemorrhage:  None Maternal uterus/adnexae: Small right ovarian corpus luteal cyst. Trace free pelvic fluid. IMPRESSION: 1. Single viable intrauterine pregnancy at 6 weeks 1 day. Fetal heart rate 120 beats per minute. 2. Small right ovarian corpus luteal cyst. Trace free pelvic fluid. Electronically Signed   By: Maisie Fus  Register   On: 03/27/2021 09:35    Marny Lowenstein, PA-C 03/27/2021 10:51 AM

## 2021-03-29 ENCOUNTER — Telehealth: Payer: Self-pay

## 2021-03-29 NOTE — Telephone Encounter (Addendum)
-----   Message from Federico Flake, MD sent at 03/27/2021 11:40 AM EDT ----- Viable Uterine pregnancy!! Patient was being followed for serial beta hcg. Please get scheduled for new OB intake and given 1st trimester precautions.   Called pt; results have been given prior. Pt has not decided on prenatal care location. Requests list of providers. Sent via MyChart. Pt will call desired office.

## 2021-04-26 ENCOUNTER — Ambulatory Visit (INDEPENDENT_AMBULATORY_CARE_PROVIDER_SITE_OTHER): Payer: Medicaid Other | Admitting: Student

## 2021-04-26 ENCOUNTER — Other Ambulatory Visit: Payer: Self-pay

## 2021-04-26 ENCOUNTER — Encounter: Payer: Self-pay | Admitting: Student

## 2021-04-26 VITALS — BP 102/66 | HR 118 | Wt 118.0 lb

## 2021-04-26 DIAGNOSIS — Z3401 Encounter for supervision of normal first pregnancy, first trimester: Secondary | ICD-10-CM

## 2021-04-26 MED ORDER — GOJJI WEIGHT SCALE MISC: 1.0000 | Freq: Once | 0 refills | Status: AC

## 2021-04-26 MED ORDER — BLOOD PRESSURE KIT
1.0000 | PACK | Freq: Once | 0 refills | Status: AC
Start: 2021-04-26 — End: 2021-04-26

## 2021-04-26 NOTE — Progress Notes (Signed)
  Subjective:    Carla Stevens is being seen today for her first obstetrical visit.  This is not a planned pregnancy. She is at [redacted]w[redacted]d gestation. Her obstetrical history is significant for  nothing . Relationship with FOB: significant other, living together. Patient does intend to breast feed. Pregnancy history fully reviewed. Patient with many questions about birthing positions, diet, exercise in pregnancy.   Patient reports nausea.  Review of Systems:   Review of Systems  Constitutional: Negative.   HENT: Negative.    Respiratory: Negative.    Cardiovascular: Negative.   Gastrointestinal: Negative.   Genitourinary: Negative.   Neurological: Negative.   Hematological: Negative.   Psychiatric/Behavioral: Negative.     Objective:     BP 102/66   Pulse (!) 118   Wt 118 lb (53.5 kg)   LMP 02/11/2021   BMI 20.90 kg/m  Physical Exam Constitutional:      Appearance: Normal appearance.  Cardiovascular:     Rate and Rhythm: Normal rate and regular rhythm.  Pulmonary:     Effort: Pulmonary effort is normal.     Breath sounds: Normal breath sounds.  Abdominal:     General: Abdomen is flat.  Musculoskeletal:        General: Normal range of motion.  Neurological:     Mental Status: She is alert.    Exam    Assessment:    Pregnancy: G1P0000 Patient Active Problem List   Diagnosis Date Noted   Encounter for supervision of normal first pregnancy in first trimester 03/27/2021       Plan:     Initial labs drawn. Prenatal vitamins. Problem list reviewed and updated. AFP3 discussed:  will do later  . Role of ultrasound in pregnancy discussed; fetal survey:  ordered . Amniocentesis discussed: not indicated. Follow up in 4 weeks. 75% of 30 min visit spent on counseling and coordination of care.  -Patient was scared of getting blood drawn but able to give samples and do genetic testing today!  -welcomed patient to practice -discussed childbirth education and birthing  positions.  -discussed weight gain, discussed diet, discussed normal changes in pregnancy -all questions answered   Charlesetta Garibaldi [facility] 04/26/2021

## 2021-04-27 ENCOUNTER — Telehealth: Payer: Self-pay | Admitting: *Deleted

## 2021-04-27 ENCOUNTER — Encounter: Payer: Self-pay | Admitting: *Deleted

## 2021-04-27 DIAGNOSIS — Z3401 Encounter for supervision of normal first pregnancy, first trimester: Secondary | ICD-10-CM

## 2021-04-27 MED ORDER — BLOOD PRESSURE KIT DEVI
1.0000 | 0 refills | Status: DC | PRN
Start: 2021-04-27 — End: 2022-07-31

## 2021-04-27 MED ORDER — GOJJI WEIGHT SCALE MISC: 1.0000 | 0 refills | Status: DC | PRN

## 2021-04-27 NOTE — Telephone Encounter (Signed)
Carla Stevens called front desk and left message her bp cuff and weight scale are not at pharmacy. Per chart was sent to [facility]. I called [facility] and verified they did not fill RX and asked them to cancel that rx. I sent new RX  to [facility] per protocol.  I called [facility] and heard a message " I'm sorry this person does not have a voicemail set up". Will reply to her Mychart message she sent.  Alden Feagan,RN

## 2021-04-29 LAB — CULTURE, OB URINE

## 2021-04-29 LAB — URINE CULTURE, OB REFLEX

## 2021-04-30 ENCOUNTER — Telehealth: Payer: Self-pay | Admitting: *Deleted

## 2021-04-30 ENCOUNTER — Other Ambulatory Visit: Payer: Self-pay | Admitting: Student

## 2021-04-30 DIAGNOSIS — B9629 Other Escherichia coli [E. coli] as the cause of diseases classified elsewhere: Secondary | ICD-10-CM

## 2021-04-30 DIAGNOSIS — N390 Urinary tract infection, site not specified: Secondary | ICD-10-CM | POA: Insufficient documentation

## 2021-04-30 HISTORY — DX: Urinary tract infection, site not specified: B96.29

## 2021-04-30 MED ORDER — AMOXICILLIN-POT CLAVULANATE 500-125 MG PO TABS: 1.0000 | ORAL_TABLET | Freq: Two times a day (BID) | ORAL | 0 refills | Status: DC

## 2021-04-30 NOTE — Telephone Encounter (Signed)
Patient called stating she had genetic testing and originally told us she did not want to know the sex; but now she wants to know the sex. She also sent a Mychart message re: this . I informed another nurse is already completing the form to notify [facility] she does want to know the sex. I encouraged her to create her [facility] Laine account and start checking for results on 05/02/21. She voices understanding. Ciarrah Rae,RN

## 2021-05-01 ENCOUNTER — Telehealth: Payer: Self-pay

## 2021-05-01 LAB — CBC/D/PLT+RPR+RH+ABO+RUBIGG...
Antibody Screen: NEGATIVE
Basophils Absolute: 0 10*3/uL (ref 0.0–0.2)
Basos: 0 %
EOS (ABSOLUTE): 0.3 10*3/uL (ref 0.0–0.4)
Eos: 2 %
HCV Ab: 0.1 s/co ratio (ref 0.0–0.9)
HIV Screen 4th Generation wRfx: NONREACTIVE
Hematocrit: 38.7 % (ref 34.0–46.6)
Hemoglobin: 13 g/dL (ref 11.1–15.9)
Hepatitis B Surface Ag: NEGATIVE
Immature Grans (Abs): 0.1 10*3/uL (ref 0.0–0.1)
Immature Granulocytes: 1 %
Lymphocytes Absolute: 3.5 10*3/uL — ABNORMAL HIGH (ref 0.7–3.1)
Lymphs: 30 %
MCH: 28.4 pg (ref 26.6–33.0)
MCHC: 33.6 g/dL (ref 31.5–35.7)
MCV: 85 fL (ref 79–97)
Monocytes Absolute: 0.9 10*3/uL (ref 0.1–0.9)
Monocytes: 7 %
Neutrophils Absolute: 7.1 10*3/uL — ABNORMAL HIGH (ref 1.4–7.0)
Neutrophils: 60 %
Platelets: 292 10*3/uL (ref 150–450)
RBC: 4.57 x10E6/uL (ref 3.77–5.28)
RDW: 14 % (ref 11.7–15.4)
RPR Ser Ql: NONREACTIVE
Rh Factor: POSITIVE
Rubella Antibodies, IGG: 3.5 index (ref >0.99)
WBC: 11.9 10*3/uL — ABNORMAL HIGH (ref 3.4–10.8)

## 2021-05-01 LAB — HEMOGLOBIN A1C
Est. average glucose Bld gHb Est-mCnc: 105 mg/dL
Hgb A1c MFr Bld: 5.3 % (ref 4.8–5.6)

## 2021-05-01 LAB — HCV INTERPRETATION

## 2021-05-01 NOTE — Telephone Encounter (Signed)
Spoke with Lillia Abed from [place]. [employer] has picked up pt's specimen on 04/30/21 and will be processed with fetal sex reported.   Judeth Cornfield, RN

## 2021-05-01 NOTE — Telephone Encounter (Signed)
-----   Message from Kathee Delton, RN sent at 04/30/2021 11:33 AM EDT ----- Carla Stevens needs to be called to add on fetal sex. Carla Stevens currently doesn't have sample, will have to try again in a couple days

## 2021-05-09 ENCOUNTER — Encounter: Payer: Self-pay | Admitting: General Practice

## 2021-05-18 ENCOUNTER — Ambulatory Visit (INDEPENDENT_AMBULATORY_CARE_PROVIDER_SITE_OTHER): Payer: Medicaid Other | Admitting: Family Medicine

## 2021-05-18 ENCOUNTER — Other Ambulatory Visit: Payer: Self-pay

## 2021-05-18 ENCOUNTER — Other Ambulatory Visit (HOSPITAL_COMMUNITY)
Admission: RE | Admit: 2021-05-18 | Discharge: 2021-05-18 | Disposition: A | Discharge status: Home / Self Care | Payer: Medicaid Other | Source: Ambulatory Visit | Attending: Family Medicine | Admitting: Family Medicine

## 2021-05-18 VITALS — BP 97/60 | HR 68 | Wt 121.5 lb

## 2021-05-18 DIAGNOSIS — N898 Other specified noninflammatory disorders of vagina: Secondary | ICD-10-CM | POA: Insufficient documentation

## 2021-05-18 DIAGNOSIS — O26899 Other specified pregnancy related conditions, unspecified trimester: Secondary | ICD-10-CM | POA: Insufficient documentation

## 2021-05-18 DIAGNOSIS — N390 Urinary tract infection, site not specified: Secondary | ICD-10-CM

## 2021-05-18 DIAGNOSIS — Z3401 Encounter for supervision of normal first pregnancy, first trimester: Secondary | ICD-10-CM

## 2021-05-18 NOTE — Progress Notes (Signed)
   Subjective:  Carla Stevens is a 20 y.o. G1P0000 at [redacted]w[redacted]d being seen today for ongoing prenatal care.  She is currently monitored for the following issues for this low-risk pregnancy and has Encounter for supervision of normal first pregnancy in first trimester and UTI due to extended-spectrum beta lactamase (ESBL) producing Escherichia coli on their problem list.  Patient reports  vaginal discharge .  Contractions: Not present. Vag. Bleeding: Small.  Movement: Present. Denies leaking of fluid.   The following portions of the patient's history were reviewed and updated as appropriate: allergies, current medications, past family history, past medical history, past social history, past surgical history and problem list. Problem list updated.  Objective:   Vitals:   05/18/21 0825  BP: 97/60  Pulse: 68  Weight: 121 lb 8 oz (55.1 kg)    Fetal Status: Fetal Heart Rate (bpm): 154   Movement: Present     General:  Alert, oriented and cooperative. Patient is in no acute distress.  Skin: Skin is warm and dry. No rash noted.   Cardiovascular: Normal heart rate noted  Respiratory: Normal respiratory effort, no problems with respiration noted  Abdomen: Soft, gravid, appropriate for gestational age. Pain/Pressure: Present     Pelvic: Vag. Bleeding: Small Vag D/C Character: White   Cervical exam deferred        Extremities: Normal range of motion.  Edema: None  Mental Status: Normal mood and affect. Normal behavior. Normal judgment and thought content.    Assessment and Plan:  Pregnancy: G1P0000 at [redacted]w[redacted]d  1. Urinary tract infection without hematuria, site unspecified Treated for bacteruria at initial OB visit. Reports completed antibiotic course. Will do TOC today - Urine Culture  2. Encounter for supervision of normal first pregnancy in first trimester Progressing well in pregnancy. No obstetric concerns at this time. Patient with questions regarding initial OB labs so discussed indications  for labs and the results in detail. Expressed understanding. Also discussed her Anatomy US will be on 9/28 (already scheduled). - Answered questions regarding frequency of prenatal visits - Taking PNV - Follow up in 4 weeks  3. Vaginal discharge during pregnancy, antepartum No bleeding, no odor - Cervicovaginal ancillary only( [place])  Term labor symptoms and general obstetric precautions including but not limited to vaginal bleeding, contractions, leaking of fluid and fetal movement were reviewed in detail with the patient. Please refer to After Visit Summary for other counseling recommendations.  Return in about 4 weeks (around 06/15/2021) for LROB, any provider.  Warner Mccreedy, MD, MPH OB Fellow, Faculty Practice

## 2021-05-18 NOTE — Progress Notes (Deleted)
   Subjective:  Carla Stevens is a 20 y.o. G1P0000 at [redacted]w[redacted]d being seen today for ongoing prenatal care.  She is currently monitored for the following issues for this low-risk pregnancy and has Encounter for supervision of normal first pregnancy in first trimester and UTI due to extended-spectrum beta lactamase (ESBL) producing Escherichia coli on their problem list.  Patient reports  vaginal discharge .  Contractions: Not present. Vag. Bleeding: Small.  Movement: Present. Denies leaking of fluid.   The following portions of the patient's history were reviewed and updated as appropriate: allergies, current medications, past family history, past medical history, past social history, past surgical history and problem list. Problem list updated.  Objective:   Vitals:   05/18/21 0825  BP: 97/60  Pulse: 68  Weight: 121 lb 8 oz (55.1 kg)    Fetal Status: Fetal Heart Rate (bpm): 154   Movement: Present     General:  Alert, oriented and cooperative. Patient is in no acute distress.  Skin: Skin is warm and dry. No rash noted.   Cardiovascular: Normal heart rate noted  Respiratory: Normal respiratory effort, no problems with respiration noted  Abdomen: Soft, gravid, appropriate for gestational age. Pain/Pressure: Present     Pelvic: Vag. Bleeding: Small Vag D/C Character: White   Cervical exam deferred        Extremities: Normal range of motion.  Edema: None  Mental Status: Normal mood and affect. Normal behavior. Normal judgment and thought content.   Urinalysis:      Assessment and Plan:  Pregnancy: G1P0000 at [redacted]w[redacted]d  1. Urinary tract infection without hematuria, site unspecified *** - Urine Culture  2. Encounter for supervision of normal first pregnancy in first trimester ***  3. Vaginal discharge during pregnancy, antepartum ***  Preterm labor symptoms and general obstetric precautions including but not limited to vaginal bleeding, contractions, leaking of fluid and fetal movement  were reviewed in detail with the patient. Please refer to After Visit Summary for other counseling recommendations.  No follow-ups on file.   Warner Mccreedy, MD

## 2021-05-20 LAB — URINE CULTURE

## 2021-05-21 LAB — CERVICOVAGINAL ANCILLARY ONLY
Bacterial Vaginitis (gardnerella): POSITIVE — AB
Candida Glabrata: NEGATIVE
Candida Vaginitis: NEGATIVE
Chlamydia: NEGATIVE
Comment: NEGATIVE
Comment: NEGATIVE
Comment: NEGATIVE
Comment: NEGATIVE
Comment: NEGATIVE
Comment: NORMAL
Neisseria Gonorrhea: NEGATIVE
Trichomonas: NEGATIVE

## 2021-05-22 ENCOUNTER — Inpatient Hospital Stay (HOSPITAL_COMMUNITY)
Admission: AD | Admit: 2021-05-22 | Discharge: 2021-05-22 | Disposition: A | Discharge status: Home / Self Care | Payer: Medicaid Other | Attending: Obstetrics & Gynecology | Admitting: Obstetrics & Gynecology

## 2021-05-22 ENCOUNTER — Other Ambulatory Visit: Payer: Self-pay

## 2021-05-22 ENCOUNTER — Other Ambulatory Visit: Payer: Self-pay | Admitting: Lactation Services

## 2021-05-22 DIAGNOSIS — O23592 Infection of other part of genital tract in pregnancy, second trimester: Secondary | ICD-10-CM | POA: Insufficient documentation

## 2021-05-22 DIAGNOSIS — F419 Anxiety disorder, unspecified: Secondary | ICD-10-CM | POA: Diagnosis present

## 2021-05-22 DIAGNOSIS — Z3A14 14 weeks gestation of pregnancy: Secondary | ICD-10-CM | POA: Insufficient documentation

## 2021-05-22 DIAGNOSIS — B9689 Other specified bacterial agents as the cause of diseases classified elsewhere: Secondary | ICD-10-CM | POA: Diagnosis not present

## 2021-05-22 DIAGNOSIS — Z711 Person with feared health complaint in whom no diagnosis is made: Secondary | ICD-10-CM | POA: Insufficient documentation

## 2021-05-22 MED ORDER — METRONIDAZOLE 500 MG PO TABS: 500.0000 mg | ORAL_TABLET | Freq: Two times a day (BID) | ORAL | 0 refills | Status: DC

## 2021-05-22 NOTE — Discharge Instructions (Signed)

## 2021-05-22 NOTE — MAU Note (Signed)
Pt seen in office on 8/24. Had vaginal swabs done. Got results in My chart with positive BV. Pt worried that it may effect her pregnancy and is worried. Stated she has some mild cramping from time to time but none right now. No vag bleeding and small amount of vag discharge that is lsee than she had last week.

## 2021-05-22 NOTE — MAU Provider Note (Signed)
Event Date/Time   First Provider Initiated Contact with Patient 05/22/21 1017      S Ms. Carla Stevens is a 20 y.o. G1P0000 patient who presents to MAU today with complaint of anxiety related to her positive BV swab. Patient states she was seen in the office recently and reported vaginal discharge. Patient states her discharge is white, smooth, thick, without color, blood or abnormal odor and today is less than it was last week. Patient reports intermittent, mild cramping, but denies any cramping today. Patient states she read online that BV can lead to PTL and she does not want to lose her baby and did not hear back from her office in a timely manner after messaging them for additional information on her results.   O BP (!) 108/59   Pulse 70   Temp 97.9 F (36.6 C)   Resp 18   Ht 5\' 3"  (1.6 m)   Wt 54 kg   LMP 02/11/2021   BMI 21.08 kg/m   Patient Vitals for the past 24 hrs:  BP Temp Pulse Resp Height Weight  05/22/21 1011 (!) 108/59 97.9 F (36.6 C) 70 18 5\' 3"  (1.6 m) 54 kg   Physical Exam Vitals and nursing note reviewed.  Constitutional:      General: She is not in acute distress.    Appearance: Normal appearance. She is not ill-appearing, toxic-appearing or diaphoretic.  HENT:     Head: Normocephalic and atraumatic.  Pulmonary:     Effort: Pulmonary effort is normal.  Neurological:     Mental Status: She is alert and oriented to person, place, and time.  Psychiatric:        Mood and Affect: Mood normal.        Behavior: Behavior normal.        Thought Content: Thought content normal.        Judgment: Judgment normal.   A Medical screening exam complete Physically well but worried  P Discharge from MAU in stable condition Discussed diagnosis of BV and when treatment is warranted, advised no antibiotics are indicated at this time, pt verbalizes understanding and agrees with plan Discussed link between BV and PTL Literature given in AVS regarding pregnancy Warning  signs for worsening condition that would warrant emergency follow-up discussed Patient may return to MAU as needed   Kalijah Westfall, 05/24/21, NP 05/22/2021 11:38 AM

## 2021-06-09 ENCOUNTER — Inpatient Hospital Stay (HOSPITAL_COMMUNITY)
Admission: AD | Admit: 2021-06-09 | Discharge: 2021-06-09 | Disposition: A | Discharge status: Home / Self Care | Payer: Medicaid Other | Attending: Obstetrics & Gynecology | Admitting: Obstetrics & Gynecology

## 2021-06-09 ENCOUNTER — Other Ambulatory Visit: Payer: Self-pay

## 2021-06-09 DIAGNOSIS — O99512 Diseases of the respiratory system complicating pregnancy, second trimester: Secondary | ICD-10-CM | POA: Diagnosis not present

## 2021-06-09 DIAGNOSIS — J3489 Other specified disorders of nose and nasal sinuses: Secondary | ICD-10-CM | POA: Insufficient documentation

## 2021-06-09 DIAGNOSIS — R0981 Nasal congestion: Secondary | ICD-10-CM

## 2021-06-09 DIAGNOSIS — Z20822 Contact with and (suspected) exposure to covid-19: Secondary | ICD-10-CM | POA: Diagnosis not present

## 2021-06-09 DIAGNOSIS — O99891 Other specified diseases and conditions complicating pregnancy: Secondary | ICD-10-CM | POA: Diagnosis not present

## 2021-06-09 DIAGNOSIS — Z28310 Unvaccinated for covid-19: Secondary | ICD-10-CM | POA: Insufficient documentation

## 2021-06-09 DIAGNOSIS — J029 Acute pharyngitis, unspecified: Secondary | ICD-10-CM | POA: Insufficient documentation

## 2021-06-09 DIAGNOSIS — Z3A16 16 weeks gestation of pregnancy: Secondary | ICD-10-CM | POA: Diagnosis not present

## 2021-06-09 LAB — RESP PANEL BY RT-PCR (FLU A&B, COVID) ARPGX2
Influenza A by PCR: NEGATIVE
Influenza B by PCR: NEGATIVE
SARS Coronavirus 2 by RT PCR: NEGATIVE

## 2021-06-09 NOTE — MAU Note (Addendum)
This morning, Nose felt dry.  Sometimes runny nose. Watery eyes. Throat feels patchy, clear nasal secretions. No breathing difficulty.  No fever.  Goes to [facility].  No vaginal bleeding. No cramps.

## 2021-06-09 NOTE — Discharge Instructions (Signed)

## 2021-06-09 NOTE — MAU Provider Note (Signed)
Event Date/Time   First Provider Initiated Contact with Patient 06/09/21 2113      S Ms. Carla Stevens is a 20 y.o. G1P0000 patient who presents to MAU today with complaint of "patchy dry throat" and runny nose since this morning. She denies pain or difficulty with swallowing and has not taken any meds for her symptoms.  Patient reports she has not knowingly been around sick individuals, but works at a [employer]. She has not had a Covid vaccine.  However, she has had a flu vaccine "a couple of months ago."  Patient denies nausea, vomiting, headache, dizziness, issues with urination, constipation, or diarrhea.  Patient denies overall pain.   O BP 106/62   Pulse 83   Temp 98.4 F (36.9 C) (Oral)   Resp 16   Ht 5\' 3"  (1.6 m)   Wt 56.7 kg   LMP 02/11/2021   SpO2 100%   BMI 22.14 kg/m  Physical Exam Vitals reviewed.  Constitutional:      Appearance: Normal appearance.  HENT:     Head: Normocephalic and atraumatic.     Mouth/Throat:     Mouth: Mucous membranes are moist. No injury or angioedema.     Palate: No lesions.     Pharynx: Posterior oropharyngeal erythema present. No oropharyngeal exudate.     Tonsils: No tonsillar exudate. 1+ on the right. 1+ on the left.     Comments: Uvula with erythema Eyes:     Conjunctiva/sclera: Conjunctivae normal.  Cardiovascular:     Rate and Rhythm: Normal rate and regular rhythm.     Heart sounds: Normal heart sounds.  Pulmonary:     Effort: Pulmonary effort is normal. No respiratory distress.     Breath sounds: Normal breath sounds.  Abdominal:     Palpations: Abdomen is soft.     Tenderness: There is no abdominal tenderness.  Neurological:     Mental Status: She is alert and oriented to person, place, and time.  Psychiatric:        Mood and Affect: Mood normal.        Behavior: Behavior normal.        Thought Content: Thought content normal.   MDM Exam Labs: Covid, Strep A Education   A 20 year old G1P0 at 16.6 weeks Sore  Throat Runny Nose  P -Covid and Strep Collected -Discussed management of symptoms including medication, warm teas, salt water gargles. -Informed that results will return and be released in next 4-6 hours via mychart. -Provider will send prescription, if needed, to pharmacy on file. -Discharge from MAU in stable condition -Warning signs for worsening condition that would warrant emergency follow-up discussed -Patient may return to MAU as needed   26, CNM 06/09/2021 9:13 PM

## 2021-06-18 ENCOUNTER — Other Ambulatory Visit: Payer: Self-pay

## 2021-06-18 ENCOUNTER — Ambulatory Visit (INDEPENDENT_AMBULATORY_CARE_PROVIDER_SITE_OTHER): Payer: Medicaid Other | Admitting: Obstetrics and Gynecology

## 2021-06-18 DIAGNOSIS — Z3A18 18 weeks gestation of pregnancy: Secondary | ICD-10-CM

## 2021-06-18 DIAGNOSIS — Z3402 Encounter for supervision of normal first pregnancy, second trimester: Secondary | ICD-10-CM

## 2021-06-18 NOTE — Progress Notes (Signed)
   PRENATAL VISIT NOTE  Subjective:  Carla Stevens is a 20 y.o. G1P0000 at [redacted]w[redacted]d being seen today for ongoing prenatal care.  She is currently monitored for the following issues for this low-risk pregnancy and has Encounter for supervision of normal first pregnancy in second trimester; UTI due to extended-spectrum beta lactamase (ESBL) producing Escherichia coli; and [redacted] weeks gestation of pregnancy on their problem list.  Patient doing well with no acute concerns today. She reports  mild pelvic pressure first thing in the morning .  Contractions: Not present. Vag. Bleeding: None.  Movement: Present. Denies leaking of fluid.   The following portions of the patient's history were reviewed and updated as appropriate: allergies, current medications, past family history, past medical history, past social history, past surgical history and problem list. Problem list updated.  Objective:   Vitals:   06/18/21 1335  BP: 97/67  Pulse: 81  Weight: 125 lb (56.7 kg)    Fetal Status: Fetal Heart Rate (bpm): 147 Fundal Height: 18 cm Movement: Present     General:  Alert, oriented and cooperative. Patient is in no acute distress.  Skin: Skin is warm and dry. No rash noted.   Cardiovascular: Normal heart rate noted  Respiratory: Normal respiratory effort, no problems with respiration noted  Abdomen: Soft, gravid, appropriate for gestational age.  Pain/Pressure: Present     Pelvic: Cervical exam deferred        Extremities: Normal range of motion.  Edema: None  Mental Status:  Normal mood and affect. Normal behavior. Normal judgment and thought content.   Assessment and Plan:  Pregnancy: G1P0000 at [redacted]w[redacted]d  1. Encounter for supervision of normal first pregnancy in second trimester Continue routine PNC, pt declined AFP today, she stated she could do it next week when she had more time - AFP, Serum, Open Spina Bifida; Future  2. [redacted] weeks gestation of pregnancy   Preterm labor symptoms and general  obstetric precautions including but not limited to vaginal bleeding, contractions, leaking of fluid and fetal movement were reviewed in detail with the patient.  Please refer to After Visit Summary for other counseling recommendations.   Return in about 4 weeks (around 07/16/2021) for ROB, in person.   Mariel Aloe, MD Faculty Attending Center for [facility]

## 2021-06-20 ENCOUNTER — Other Ambulatory Visit: Payer: Self-pay

## 2021-06-20 ENCOUNTER — Other Ambulatory Visit: Payer: Self-pay | Admitting: *Deleted

## 2021-06-20 ENCOUNTER — Ambulatory Visit: Payer: Medicaid Other | Attending: Student

## 2021-06-20 DIAGNOSIS — Z362 Encounter for other antenatal screening follow-up: Secondary | ICD-10-CM

## 2021-06-20 DIAGNOSIS — Z3401 Encounter for supervision of normal first pregnancy, first trimester: Secondary | ICD-10-CM | POA: Insufficient documentation

## 2021-06-25 ENCOUNTER — Other Ambulatory Visit: Payer: Medicaid Other

## 2021-07-16 ENCOUNTER — Ambulatory Visit: Payer: Medicaid Other | Admitting: *Deleted

## 2021-07-16 ENCOUNTER — Ambulatory Visit: Payer: Medicaid Other | Attending: Obstetrics and Gynecology

## 2021-07-16 ENCOUNTER — Encounter: Payer: Self-pay | Admitting: *Deleted

## 2021-07-16 ENCOUNTER — Other Ambulatory Visit: Payer: Self-pay

## 2021-07-16 VITALS — BP 101/52 | HR 65

## 2021-07-16 DIAGNOSIS — Z3402 Encounter for supervision of normal first pregnancy, second trimester: Secondary | ICD-10-CM | POA: Diagnosis present

## 2021-07-16 DIAGNOSIS — Z3A22 22 weeks gestation of pregnancy: Secondary | ICD-10-CM

## 2021-07-16 DIAGNOSIS — Z362 Encounter for other antenatal screening follow-up: Secondary | ICD-10-CM | POA: Diagnosis not present

## 2021-07-17 ENCOUNTER — Encounter: Payer: Self-pay | Admitting: Family Medicine

## 2021-07-17 ENCOUNTER — Other Ambulatory Visit: Payer: Medicaid Other

## 2021-07-17 ENCOUNTER — Ambulatory Visit (INDEPENDENT_AMBULATORY_CARE_PROVIDER_SITE_OTHER): Payer: Medicaid Other | Admitting: Family Medicine

## 2021-07-17 VITALS — BP 104/67 | HR 87 | Wt 132.3 lb

## 2021-07-17 DIAGNOSIS — Z3402 Encounter for supervision of normal first pregnancy, second trimester: Secondary | ICD-10-CM

## 2021-07-17 DIAGNOSIS — Z3A22 22 weeks gestation of pregnancy: Secondary | ICD-10-CM

## 2021-07-17 NOTE — Progress Notes (Signed)
    Subjective:  Carla Stevens is a 20 y.o. G1P0000 at [redacted]w[redacted]d being seen today for ongoing prenatal care.  She is currently monitored for the following issues for this low-risk pregnancy and has Encounter for supervision of normal first pregnancy in second trimester and UTI due to extended-spectrum beta lactamase (ESBL) producing Escherichia coli on their problem list.  Patient reports  end of the day swelling of her bilateral feet . On her feet all day at work. Just feeling generally tired but doing overall good.  Contractions: Not present. Vag. Bleeding: None.  Movement: Present. Denies leaking of fluid.   The following portions of the patient's history were reviewed and updated as appropriate: allergies, current medications, past family history, past medical history, past social history, past surgical history and problem list.   Objective:   Vitals:   07/17/21 1004  BP: 104/67  Pulse: 87  Weight: 132 lb 4.8 oz (60 kg)    Fetal Status: Fetal Heart Rate (bpm): 148 Fundal Height: 22 cm Movement: Present     General:  Alert, oriented and cooperative. Patient is in no acute distress.  Skin: Skin is warm and dry. No rash noted.   Cardiovascular: Normal heart rate noted  Respiratory: Normal respiratory effort, no problems with respiration noted  Abdomen: Soft, gravid, appropriate for gestational age. Pain/Pressure: Absent     Pelvic:  Cervical exam deferred        Extremities: Normal range of motion.  Edema: None noted on exam   Mental Status: Normal mood and affect. Normal behavior. Normal judgment and thought content.    Assessment and Plan:  Pregnancy: G1P0000 at [redacted]w[redacted]d  1. Encounter for supervision of normal first pregnancy in second trimester Doing overall well.   2. [redacted] weeks gestation of pregnancy Recent anatomy US on 10/24 follow up normal.   3. Bilateral lower extremity swelling Not currently present, at the end of day after standing for long periods. No concern for DVT.  Encouraged trial of compression stockings, sitting/moving her legs when possible to allow appropriate venous return. Elevate legs at night.   Preterm labor symptoms and general obstetric precautions including but not limited to vaginal bleeding, contractions, leaking of fluid and fetal movement were reviewed in detail with the patient. Please refer to After Visit Summary for other counseling recommendations.   Return in about 3 weeks (around 08/07/2021) for LROB.   Allayne Stack, DO

## 2021-07-19 LAB — AFP, SERUM, OPEN SPINA BIFIDA
AFP MoM: 0.82
AFP Value: 77 ng/mL
Gest. Age on Collection Date: 22.1 weeks
Maternal Age At EDD: 20.9 yr
OSBR Risk 1 IN: 10000
Test Results:: NEGATIVE
Weight: 132 [lb_av]

## 2021-08-09 ENCOUNTER — Other Ambulatory Visit: Payer: Self-pay

## 2021-08-09 ENCOUNTER — Encounter: Payer: Self-pay | Admitting: Obstetrics and Gynecology

## 2021-08-09 ENCOUNTER — Ambulatory Visit (INDEPENDENT_AMBULATORY_CARE_PROVIDER_SITE_OTHER): Payer: Medicaid Other | Admitting: Obstetrics and Gynecology

## 2021-08-09 VITALS — BP 108/69 | HR 76 | Wt 136.6 lb

## 2021-08-09 DIAGNOSIS — Z3402 Encounter for supervision of normal first pregnancy, second trimester: Secondary | ICD-10-CM

## 2021-08-09 NOTE — Progress Notes (Signed)
   PRENATAL VISIT NOTE  Subjective:  Carla Stevens is a 20 y.o. G1P0000 at [redacted]w[redacted]d being seen today for ongoing prenatal care.  She is currently monitored for the following issues for this low-risk pregnancy and has Encounter for supervision of normal first pregnancy in second trimester and UTI due to extended-spectrum beta lactamase (ESBL) producing Escherichia coli on their problem list.  Patient reports no complaints.  Contractions: Not present. Vag. Bleeding: None.  Movement: Present. Denies leaking of fluid.   The following portions of the patient's history were reviewed and updated as appropriate: allergies, current medications, past family history, past medical history, past social history, past surgical history and problem list.   Objective:   Vitals:   08/09/21 1102  BP: 108/69  Pulse: 76  Weight: 136 lb 9.6 oz (62 kg)    Fetal Status: Fetal Heart Rate (bpm): 150 Fundal Height: 26 cm Movement: Present     General:  Alert, oriented and cooperative. Patient is in no acute distress.  Skin: Skin is warm and dry. No rash noted.   Cardiovascular: Normal heart rate noted  Respiratory: Normal respiratory effort, no problems with respiration noted  Abdomen: Soft, gravid, appropriate for gestational age.  Pain/Pressure: Absent     Pelvic: Cervical exam deferred        Extremities: Normal range of motion.  Edema: Trace  Mental Status: Normal mood and affect. Normal behavior. Normal judgment and thought content.   Assessment and Plan:  Pregnancy: G1P0000 at [redacted]w[redacted]d 1. Encounter for supervision of normal first pregnancy in second trimester Patient is doing well without complaints Third trimester labs and glucola next visit Patient is still researching pediatrician Patient does not plan hormonal contraception or LARC  Preterm labor symptoms and general obstetric precautions including but not limited to vaginal bleeding, contractions, leaking of fluid and fetal movement were reviewed in  detail with the patient. Please refer to After Visit Summary for other counseling recommendations.   Return in about 4 weeks (around 09/06/2021) for in person, ROB.  No future appointments.  Catalina Antigua, MD

## 2021-09-05 ENCOUNTER — Other Ambulatory Visit: Payer: Self-pay | Admitting: *Deleted

## 2021-09-05 DIAGNOSIS — Z3402 Encounter for supervision of normal first pregnancy, second trimester: Secondary | ICD-10-CM

## 2021-09-06 ENCOUNTER — Ambulatory Visit (INDEPENDENT_AMBULATORY_CARE_PROVIDER_SITE_OTHER): Payer: Medicaid Other | Admitting: Student

## 2021-09-06 ENCOUNTER — Other Ambulatory Visit: Payer: Self-pay

## 2021-09-06 ENCOUNTER — Other Ambulatory Visit: Payer: Medicaid Other

## 2021-09-06 VITALS — BP 101/66 | HR 73

## 2021-09-06 DIAGNOSIS — Z23 Encounter for immunization: Secondary | ICD-10-CM

## 2021-09-06 DIAGNOSIS — Z3402 Encounter for supervision of normal first pregnancy, second trimester: Secondary | ICD-10-CM

## 2021-09-06 DIAGNOSIS — Z3A28 28 weeks gestation of pregnancy: Secondary | ICD-10-CM

## 2021-09-06 DIAGNOSIS — Z3A29 29 weeks gestation of pregnancy: Secondary | ICD-10-CM

## 2021-09-06 NOTE — Progress Notes (Signed)
° °  PRENATAL VISIT NOTE  Subjective:  Carla Stevens is a 20 y.o. G1P0000 at [redacted]w[redacted]d being seen today for ongoing prenatal care.  She is currently monitored for the following issues for this low-risk pregnancy and has Encounter for supervision of normal first pregnancy in second trimester and UTI due to extended-spectrum beta lactamase (ESBL) producing Escherichia coli on their problem list.  Patient reports  leaking from nipples. She also reports some heavy pelvic pain.  .  Contractions: Not present. Vag. Bleeding: None.  Movement: Present. Denies leaking of fluid.   The following portions of the patient's history were reviewed and updated as appropriate: allergies, current medications, past family history, past medical history, past social history, past surgical history and problem list.   Objective:   Vitals:   09/06/21 0840  BP: 101/66  Pulse: 73    Fetal Status: Fetal Heart Rate (bpm): 141 Fundal Height: 28 cm Movement: Present     General:  Alert, oriented and cooperative. Patient is in no acute distress.  Skin: Skin is warm and dry. No rash noted.   Cardiovascular: Normal heart rate noted  Respiratory: Normal respiratory effort, no problems with respiration noted  Abdomen: Soft, gravid, appropriate for gestational age.  Pain/Pressure: Present     Pelvic: Cervical exam deferred        Extremities: Normal range of motion.     Mental Status: Normal mood and affect. Normal behavior. Normal judgment and thought content.   Assessment and Plan:  Pregnancy: G1P0000 at [redacted]w[redacted]d 1. [redacted] weeks gestation of pregnancy -doing well, reviewed the milk secretions are normal, as is pelvic pain.  -reviewed warning signs - Tdap vaccine greater than or equal to 7yo IM -still leaning towards no contraception; does not want a LARC  Preterm labor symptoms and general obstetric precautions including but not limited to vaginal bleeding, contractions, leaking of fluid and fetal movement were reviewed in detail  with the patient. Please refer to After Visit Summary for other counseling recommendations.   Return in about 3 weeks (around 09/27/2021), or LROB with KK.  No future appointments.  Marylene Land, CNM

## 2021-09-06 NOTE — Progress Notes (Signed)
Patient reports discharge from both nipples. She stated that sometimes its "clear fluid" and other times its "milk". Denies odor/pain in breast.

## 2021-09-07 LAB — CBC
Hematocrit: 34.6 % (ref 34.0–46.6)
Hemoglobin: 11.7 g/dL (ref 11.1–15.9)
MCH: 28.5 pg (ref 26.6–33.0)
MCHC: 33.8 g/dL (ref 31.5–35.7)
MCV: 84 fL (ref 79–97)
Platelets: 309 10*3/uL (ref 150–450)
RBC: 4.11 x10E6/uL (ref 3.77–5.28)
RDW: 12.1 % (ref 11.7–15.4)
WBC: 12.9 10*3/uL — ABNORMAL HIGH (ref 3.4–10.8)

## 2021-09-07 LAB — GLUCOSE TOLERANCE, 2 HOURS W/ 1HR
Glucose, 1 hour: 121 mg/dL (ref 70–179)
Glucose, 2 hour: 103 mg/dL (ref 70–152)
Glucose, Fasting: 86 mg/dL (ref 70–91)

## 2021-09-07 LAB — HIV ANTIBODY (ROUTINE TESTING W REFLEX): HIV Screen 4th Generation wRfx: NONREACTIVE

## 2021-09-07 LAB — RPR: RPR Ser Ql: NONREACTIVE

## 2021-09-23 NOTE — L&D Delivery Note (Signed)
Obstetrical Delivery Note   Date of Delivery:   11/20/2021 Primary OB:   [facility] Gestational Age/EDD: [redacted]w[redacted]d Reason for Admission: Early labor Antepartum complications: UTI with negative test of cure  Delivered By:   [place] Bing, MD  Delivery Type:   vacuum, outlet  Delivery Details:   Patient with poor maternal effort and exhaustion with fetus to +3 to +4, DOA and fetus tolerating pushing well with q3-67m UCs. Foley just removed and operative vaginal delivery recommended to patient with vacuum, with risks and benefits d/w her and she was amenable to trial. Peds team already present and Kiwi sponge applied to flexion point and then with next contraction, it was used per manufacturer's instructions and the fetus easily delivered with no pop offs; nuchal cord x 1 reduced prior to delivery. Cord clamped and cut and handed to peds team due to baby with weak effort at delivery but then the baby quickly responded to usual stimulation. Anesthesia:    epidural and local Intrapartum complications: Prolonged 2nd stage. PPH due to atony and midline vaginal laceration GBS:    Negative Laceration:    2nd degree with left labial laceration Episiotomy:    none Rectal exam:   deferred Placenta:    Delivered and expressed via active management. Intact: yes. To pathology: no.  Delayed Cord Clamping: no Estimated Blood Loss:   Baby:    Liveborn female, APGARs 7/9, weight 2863gm  Cornelia Copa. MD Attending Center for [employer])

## 2021-10-03 ENCOUNTER — Other Ambulatory Visit: Payer: Self-pay

## 2021-10-03 ENCOUNTER — Ambulatory Visit (INDEPENDENT_AMBULATORY_CARE_PROVIDER_SITE_OTHER): Payer: Medicaid Other | Admitting: Student

## 2021-10-03 VITALS — BP 105/70 | HR 81 | Wt 146.6 lb

## 2021-10-03 DIAGNOSIS — Z3A33 33 weeks gestation of pregnancy: Secondary | ICD-10-CM

## 2021-10-03 DIAGNOSIS — Z3402 Encounter for supervision of normal first pregnancy, second trimester: Secondary | ICD-10-CM

## 2021-10-03 NOTE — Progress Notes (Signed)
° °  PRENATAL VISIT NOTE  Subjective:  Carla Stevens is a 21 y.o. G1P0000 at [redacted]w[redacted]d being seen today for ongoing prenatal care.  She is currently monitored for the following issues for this low-risk pregnancy and has Encounter for supervision of normal first pregnancy in second trimester and UTI due to extended-spectrum beta lactamase (ESBL) producing Escherichia coli on their problem list.  Patient reports no complaints. She has questions about labor and delivery.  Contractions: Irritability. Vag. Bleeding: None.  Movement: Present. Denies leaking of fluid.   The following portions of the patient's history were reviewed and updated as appropriate: allergies, current medications, past family history, past medical history, past social history, past surgical history and problem list.   Objective:   Vitals:   10/03/21 1042  BP: 105/70  Pulse: 81  Weight: 146 lb 9.6 oz (66.5 kg)    Fetal Status: Fetal Heart Rate (bpm): 138   Movement: Present     General:  Alert, oriented and cooperative. Patient is in no acute distress.  Skin: Skin is warm and dry. No rash noted.   Cardiovascular: Normal heart rate noted  Respiratory: Normal respiratory effort, no problems with respiration noted  Abdomen: Soft, gravid, appropriate for gestational age.  Pain/Pressure: Present     Pelvic: Cervical exam deferred        Extremities: Normal range of motion.  Edema: Trace  Mental Status: Normal mood and affect. Normal behavior. Normal judgment and thought content.   Assessment and Plan:  Pregnancy: G1P0000 at [redacted]w[redacted]d 1. Encounter for supervision of normal first pregnancy in second trimester -patient doing well; no other complaints discussed l and d procedures, signs of labor, when to come to MAU -discussed birth control options and pediatrician, patient will bring to next visit -reviewed 2 hour GTT results  Preterm labor symptoms and general obstetric precautions including but not limited to vaginal bleeding,  contractions, leaking of fluid and fetal movement were reviewed in detail with the patient. Please refer to After Visit Summary for other counseling recommendations.   Return in about 3 weeks (around 10/24/2021), or LROB with [place].  Future Appointments  Date Time Provider [place]  11/01/2021 10:35 AM Starr Lake, CNM [facility]    Mervyn Skeeters [place], [place]

## 2021-10-10 ENCOUNTER — Encounter: Payer: Self-pay | Admitting: Student

## 2021-10-14 ENCOUNTER — Other Ambulatory Visit: Payer: Self-pay

## 2021-10-14 ENCOUNTER — Inpatient Hospital Stay (HOSPITAL_COMMUNITY)
Admission: AD | Admit: 2021-10-14 | Discharge: 2021-10-14 | Disposition: A | Discharge status: Home / Self Care | Payer: Medicaid Other | Attending: Family Medicine | Admitting: Family Medicine

## 2021-10-14 DIAGNOSIS — R030 Elevated blood-pressure reading, without diagnosis of hypertension: Secondary | ICD-10-CM | POA: Insufficient documentation

## 2021-10-14 DIAGNOSIS — O163 Unspecified maternal hypertension, third trimester: Secondary | ICD-10-CM | POA: Diagnosis not present

## 2021-10-14 DIAGNOSIS — Z3A35 35 weeks gestation of pregnancy: Secondary | ICD-10-CM | POA: Insufficient documentation

## 2021-10-14 DIAGNOSIS — Z3689 Encounter for other specified antenatal screening: Secondary | ICD-10-CM | POA: Insufficient documentation

## 2021-10-14 DIAGNOSIS — Z711 Person with feared health complaint in whom no diagnosis is made: Secondary | ICD-10-CM | POA: Insufficient documentation

## 2021-10-14 DIAGNOSIS — Z3402 Encounter for supervision of normal first pregnancy, second trimester: Secondary | ICD-10-CM

## 2021-10-14 LAB — URINALYSIS, ROUTINE W REFLEX MICROSCOPIC
Bilirubin Urine: NEGATIVE
Glucose, UA: NEGATIVE mg/dL
Hgb urine dipstick: NEGATIVE
Ketones, ur: NEGATIVE mg/dL
Nitrite: NEGATIVE
Protein, ur: NEGATIVE mg/dL
Specific Gravity, Urine: 1.01 (ref 1.005–1.030)
pH: 7 (ref 5.0–8.0)

## 2021-10-14 NOTE — MAU Note (Signed)
Pt states that after she ate, she felt kind of dizzy and out of breath. She then checked her blood pressure with a reading of 113/98. The next pressure 30 minutes later was 119/96. Pt denies RUQ pain, seeing spots, and no headache. No vaginal bleeding. Positive fetal movement. Toya Smothers, RN

## 2021-10-14 NOTE — MAU Provider Note (Signed)
Event Date/Time   First Provider Initiated Contact with Patient 10/14/21 1434     S Ms. Carla Stevens is a 21 y.o. G1P0000 pregnant female at [redacted]w[redacted]d who presents to MAU today with complaint of high blood pressure at home (100s-110s/90s).  She felt dizzy after eating pancakes, eggs and bacon so took her BP twice and got those elevated readings. No other physical complaints.   Receives care at [facility]. Prenatal records reviewed.  Pertinent items noted in HPI and remainder of comprehensive ROS otherwise negative.   O BP (!) 93/54 (BP Location: Right Arm)    Pulse 87    Temp 98.8 F (37.1 C) (Oral)    Resp 16    LMP 02/11/2021    SpO2 99%  Physical Exam Vitals and nursing note reviewed.  Constitutional:      Appearance: Normal appearance.  HENT:     Head: Normocephalic and atraumatic.     Mouth/Throat:     Mouth: Mucous membranes are moist.  Eyes:     Pupils: Pupils are equal, round, and reactive to light.  Cardiovascular:     Rate and Rhythm: Normal rate and regular rhythm.     Pulses: Normal pulses.  Pulmonary:     Effort: Pulmonary effort is normal.  Abdominal:     Palpations: Abdomen is soft.  Musculoskeletal:        General: Normal range of motion.     Cervical back: Normal range of motion.  Skin:    General: Skin is warm.     Capillary Refill: Capillary refill takes less than 2 seconds.  Neurological:     Mental Status: She is alert and oriented to person, place, and time.  Psychiatric:        Mood and Affect: Mood normal.        Behavior: Behavior normal.        Thought Content: Thought content normal.        Judgment: Judgment normal.   Fetal Tracing: reactive Baseline: 130 Variability: moderate Accelerations: 15x15 Decelerations: none Toco: mild irregular, q7+min, pt not feeling any contractions  MDM/MAU Course Completely normotensive on arrival to MAU, no s/sx of PEC. Reassurance given, discussed normal fluctuations in BP and gave parameters for when to come to  MAU for hypertensive episodes. Pt expressed understanding.  A Physically well, but worried NST reactive Medical screening exam complete  P Discharge from MAU in stable condition with return precautions Follow up at [facility] as scheduled for ongoing prenatal care  Bernerd Limbo, CNM 10/14/2021 2:45 PM

## 2021-10-14 NOTE — MAU Note (Signed)
...  Carla Stevens is a 21 y.o. at [redacted]w[redacted]d here in MAU reporting: Elevated BP's at home today. 113/98 and then one hour later it was 119/96. She states around 1205 today she became dizzy after eating which is what made her take her BP. Denies RUQ pain, visual disturbances, and swelling. No VB or LOF.  Pain score: Denies pain.   FHT: 133 initial external Lab orders placed from triage: UA

## 2021-10-30 ENCOUNTER — Inpatient Hospital Stay (HOSPITAL_COMMUNITY)
Admission: AD | Admit: 2021-10-30 | Discharge: 2021-10-30 | Disposition: A | Discharge status: Home / Self Care | Payer: Medicaid Other | Attending: Family Medicine | Admitting: Family Medicine

## 2021-10-30 ENCOUNTER — Encounter (HOSPITAL_COMMUNITY): Payer: Self-pay | Admitting: Family Medicine

## 2021-10-30 ENCOUNTER — Other Ambulatory Visit: Payer: Self-pay

## 2021-10-30 DIAGNOSIS — M549 Dorsalgia, unspecified: Secondary | ICD-10-CM | POA: Diagnosis not present

## 2021-10-30 DIAGNOSIS — Z3A37 37 weeks gestation of pregnancy: Secondary | ICD-10-CM | POA: Insufficient documentation

## 2021-10-30 DIAGNOSIS — O99891 Other specified diseases and conditions complicating pregnancy: Secondary | ICD-10-CM | POA: Diagnosis not present

## 2021-10-30 DIAGNOSIS — O26893 Other specified pregnancy related conditions, third trimester: Secondary | ICD-10-CM | POA: Insufficient documentation

## 2021-10-30 LAB — URINALYSIS, ROUTINE W REFLEX MICROSCOPIC
Bilirubin Urine: NEGATIVE
Glucose, UA: 50 mg/dL — AB
Hgb urine dipstick: NEGATIVE
Ketones, ur: NEGATIVE mg/dL
Nitrite: NEGATIVE
Protein, ur: NEGATIVE mg/dL
Specific Gravity, Urine: 1.012 (ref 1.005–1.030)
pH: 6 (ref 5.0–8.0)

## 2021-10-30 NOTE — MAU Provider Note (Signed)
History     CSN: [id number]  Arrival date and time: 10/30/21 1105   Event Date/Time   First Provider Initiated Contact with Patient 10/30/21 1201      Chief Complaint  Patient presents with   Back Pain   HPI Carla Stevens is a 21 y.o. G1P0000 at [redacted]w[redacted]d who presents with back pain. Symptoms started this morning. Initially was a constant sharp pain in her mid lower back. Rates pain 9/10. Hasn't treated pain. Pain was worse with bending over. Pain is now intermittent. Has had some intermittent abdominal tightening as well but can't tell if it's related.  Denies fever, n/v/d, dysuria, hematuria, flank pain, vaginal bleeding, or LOF. Reports good fetal movement.   OB History     Gravida  1   Para  0   Term  0   Preterm  0   AB  0   Living  0      SAB  0   IAB  0   Ectopic  0   Multiple  0   Live Births  0           Past Medical History:  Diagnosis Date   Medical history non-contributory     Past Surgical History:  Procedure Laterality Date   NO PAST SURGERIES      Family History  Problem Relation Age of Onset   Asthma Mother    Cancer Maternal Grandmother    Kidney disease Maternal Grandfather     Social History   Tobacco Use   Smoking status: Never    Passive exposure: Yes   Smokeless tobacco: Never  Vaping Use   Vaping Use: Never used  Substance Use Topics   Alcohol use: No   Drug use: No    Allergies:  Allergies  Allergen Reactions   Watermelon [Citrullus Vulgaris] Hives    Medications Prior to Admission  Medication Sig Dispense Refill Last Dose   Prenatal Vit-Fe Fumarate-FA (MULTIVITAMIN-PRENATAL) 27-0.8 MG TABS tablet Take 1 tablet by mouth daily at 12 noon.   10/29/2021   Blood Pressure Monitoring (BLOOD PRESSURE KIT) DEVI 1 Device by Does not apply route as needed. 1 each 0    Misc. Devices (GOJJI WEIGHT SCALE) MISC 1 Device by Does not apply route as needed. (Patient not taking: Reported on 05/18/2021) 1 each 0     Review of  Systems  Constitutional: Negative.   Gastrointestinal: Negative.   Genitourinary: Negative.   Musculoskeletal:  Positive for back pain.  Physical Exam   Blood pressure 112/63, pulse 75, temperature 98.3 F (36.8 C), temperature source Oral, resp. rate 18, height $RemoveBe'5\' 4"'zGSwuATHd$  (1.626 m), weight 69.6 kg, last menstrual period 02/11/2021, SpO2 100 %.  Physical Exam Vitals and nursing note reviewed.  Constitutional:      General: She is not in acute distress.    Appearance: Normal appearance.  HENT:     Head: Normocephalic and atraumatic.  Eyes:     General: No scleral icterus.    Conjunctiva/sclera: Conjunctivae normal.  Pulmonary:     Effort: Pulmonary effort is normal. No respiratory distress.  Abdominal:     Tenderness: There is no right CVA tenderness or left CVA tenderness.  Musculoskeletal:     Lumbar back: Normal.  Skin:    General: Skin is warm and dry.  Neurological:     General: No focal deficit present.     Mental Status: She is alert.  Psychiatric:        Mood and  Affect: Mood normal.        Behavior: Behavior normal.   NST:  Baseline: 140 bpm, Variability: Good {> 6 bpm), Accelerations: Reactive, and Decelerations: Absent  MAU Course  Procedures Results for orders placed or performed during the hospital encounter of 10/30/21 (from the past 24 hour(s))  Urinalysis, Routine w reflex microscopic Urine, Clean Catch     Status: Abnormal   Collection Time: 10/30/21 11:41 AM  Result Value Ref Range   Color, Urine YELLOW YELLOW   APPearance CLOUDY (A) CLEAR   Specific Gravity, Urine 1.012 1.005 - 1.030   pH 6.0 5.0 - 8.0   Glucose, UA 50 (A) NEGATIVE mg/dL   Hgb urine dipstick NEGATIVE NEGATIVE   Bilirubin Urine NEGATIVE NEGATIVE   Ketones, ur NEGATIVE NEGATIVE mg/dL   Protein, ur NEGATIVE NEGATIVE mg/dL   Nitrite NEGATIVE NEGATIVE   Leukocytes,Ua SMALL (A) NEGATIVE   RBC / HPF 0-5 0 - 5 RBC/hpf   WBC, UA 6-10 0 - 5 WBC/hpf   Bacteria, UA FEW (A) NONE SEEN    Squamous Epithelial / LPF 21-50 0 - 5   Mucus PRESENT    Amorphous Crystal PRESENT     MDM Patient presents with low back pain. No tender on exam. Some contractions on monitor; cervix closed per RN evaluation.  Reactive NST U/a with small leuks. Will send for culture.  Heat pack applied to back while in MAU. Patient declined tylenol.   Assessment and Plan   1. Back pain affecting pregnancy in third trimester   2. [redacted] weeks gestation of pregnancy    -reviewed reasons to return to MAU -discussed interventions at home to help with back -keep appt in office on Thursday  Jorje Guild 10/30/2021, 1:00 PM

## 2021-10-30 NOTE — MAU Note (Signed)
Presents with c/o sharp constant lower back pain that began this morning.  Reports hasn't taken any meds for pain.  Denies UTI symptoms.  Endorses +FM.  Denies LOF or VB.

## 2021-10-31 LAB — CULTURE, OB URINE: Special Requests: NORMAL

## 2021-11-01 ENCOUNTER — Other Ambulatory Visit: Payer: Self-pay

## 2021-11-01 ENCOUNTER — Ambulatory Visit (INDEPENDENT_AMBULATORY_CARE_PROVIDER_SITE_OTHER): Payer: Medicaid Other | Admitting: Student

## 2021-11-01 ENCOUNTER — Other Ambulatory Visit (HOSPITAL_COMMUNITY)
Admission: RE | Admit: 2021-11-01 | Discharge: 2021-11-01 | Disposition: A | Discharge status: Home / Self Care | Payer: Medicaid Other | Source: Ambulatory Visit | Attending: Student | Admitting: Student

## 2021-11-01 VITALS — BP 120/72 | HR 91 | Wt 152.1 lb

## 2021-11-01 DIAGNOSIS — Z3A37 37 weeks gestation of pregnancy: Secondary | ICD-10-CM

## 2021-11-01 DIAGNOSIS — Z3402 Encounter for supervision of normal first pregnancy, second trimester: Secondary | ICD-10-CM | POA: Diagnosis present

## 2021-11-01 LAB — OB RESULTS CONSOLE GC/CHLAMYDIA: Gonorrhea: NEGATIVE

## 2021-11-01 NOTE — Progress Notes (Signed)
° °  PRENATAL VISIT NOTE  Subjective:  Carla Stevens is a 21 y.o. G1P0000 at [redacted]w[redacted]d being seen today for ongoing prenatal care.  She is currently monitored for the following issues for this low-risk pregnancy and has Encounter for supervision of normal first pregnancy in second trimester and UTI due to extended-spectrum beta lactamase (ESBL) producing Escherichia coli on their problem list.  Patient reports some menstrual cramps.  Contractions: Irritability. Vag. Bleeding: None.  Movement: Present. Denies leaking of fluid.   The following portions of the patient's history were reviewed and updated as appropriate: allergies, current medications, past family history, past medical history, past social history, past surgical history and problem list.   Objective:   Vitals:   11/01/21 1049  BP: 120/72  Pulse: 91  Weight: 152 lb 1.6 oz (69 kg)    Fetal Status: Fetal Heart Rate (bpm): 139 Fundal Height: 37 cm Movement: Present  Presentation: Vertex  General:  Alert, oriented and cooperative. Patient is in no acute distress.  Skin: Skin is warm and dry. No rash noted.   Cardiovascular: Normal heart rate noted  Respiratory: Normal respiratory effort, no problems with respiration noted  Abdomen: Soft, gravid, appropriate for gestational age.  Pain/Pressure: Present     Pelvic: Cervical exam performed in the presence of a chaperone Dilation: Closed      Extremities: Normal range of motion.  Edema: Trace  Mental Status: Normal mood and affect. Normal behavior. Normal judgment and thought content.   Assessment and Plan:  Pregnancy: G1P0000 at [redacted]w[redacted]d 1. Encounter for supervision of normal first pregnancy in second trimester -vertex by [place] - GC/Chlamydia probe amp ([place])not at [facility] - Culture, beta strep (group b only) -5-1-1 rule reviewed, when to come to hospital Term labor symptoms and general obstetric precautions including but not limited to vaginal bleeding, contractions, leaking of fluid  and fetal movement were reviewed in detail with the patient. Please refer to After Visit Summary for other counseling recommendations.   Return in about 2 weeks (around 11/15/2021), or LROb with KK or other CNM.  No future appointments.  Marylene Land, CNM

## 2021-11-01 NOTE — Patient Instructions (Signed)
AREA PEDIATRIC/FAMILY PRACTICE PHYSICIANS  [place]outheast [place] ([address]) [facility] Chambliss, MD; Eniola, MD; Hale, MD; Hensel, MD; McDiarmid, MD; McIntyer, MD; Neal, MD; Walden, MD [address] [place], [place] [address] [phone] Mon-Fri 8:30-12:30, 1:30-5:00 Providers come to see babies at [facility] Accepting Medicaid [facility] providers who accept newborns: Koirala, MD; Morrow, MD; Wolters, MD [address], [place], [place] [address] [phone] Mon-Fri 8:00-5:30 Babies seen by providers at [facility] Does NOT accept Medicaid Please call early in hospitalization for appointment (limited availability)  [facility] Mulberry, MD [address] [place], [place] [address] [phone] Mon, Tue, Thur, Fri 8:30-5:00, Wed 10:00-7:00 (closed 1-2pm) Babies seen by [facility] providers Accepting Medicaid Rubin - Pediatrician Rubin, MD [address], [place], [place] [address] [phone] Mon-Fri 8:30-5:00, Sat 8:30-12:00 Provider comes to see babies at [facility] Accepting Medicaid Must have been referred from current patients or contacted office prior to delivery [facility] ([facility]) Brown, MD; Chandler, MD; Ettefagh, MD; Grant, MD; Lester, MD; McCormick, MD; McQueen, MD; Prose, MD; Simha, MD; Stanley, MD; Stryffeler, NP; Tebben, NP [address], [place], [place] [address] [phone] Mon, Tue, Thur, Fri 8:30-5:30, Wed 9:30-5:30, Sat 8:30-12:30 Babies seen by [facility] providers Accepting Medicaid Only accepting infants of first-time parents or siblings of current patients Hospital discharge coordinator will make follow-up appointment Jack Amos [address], [place], [place]  [address] [phone]   Fax - [phone] [facility] [address], [place], [place]  [address] Phone - [phone]   Fax - [phone] Shilpa Gosrani [address], [place], [place]  [address] [phone]  [place] ([address]) [facility] Bates, MD; Brassfield, MD; Cooper, Cox, MD; MD; Davis, MD; Dovico, MD; Ettefaugh, MD; Little, MD; Lowe, MD; Keiffer, MD; Melvin, MD; Sumner, MD; Williams, MD [address], [place], [place] [address] [phone] Mon-Fri 8:30-5:00 (extended evenings Mon-Thur as needed), Sat-Sun 10:00-1:00 Providers come to see babies at [facility] Accepting Medicaid for families of first-time babies and families with all children in the household age 3 and under. Must register with office prior to making appointment (M-F only). [facility] Henson, NP; Knapp, MD; Lalonde, MD; Tysinger, PA [address] [place], [place] [address] [phone] Mon-Fri 8:00-5:00 Babies seen by providers at [facility] Does NOT accept Medicaid/Commercial Insurance Only [facility] ([facility])  Artis, MD; Barnes, MD; Bratton, MD; Coccaro, MD; Lockett [place], MD; Kramer, MD; Marshall, MD; Netherton, MD; Poleto, MD; Skinner, MD [address] [place], [place] [address] [phone] Mon-Fri 8:30-5:30, Sat (Oct.-Mar.) 9:00-1:00 Babies seen by providers at [facility] Accepting Medicaid  [place] ([address]) [facility] Reid, MD; Warner, MD [address], [place], [place] [address] [phone] Mon-Fri 8:30-5:00, Sat 8:30-12:00 Providers come to see babies at [facility] Does NOT accept Medicaid [facility] Becker, PA; Hagler, MD; Scifres, PA; Sun, MD; Swayne, MD [address], [place],  [address] [phone] Mon-Fri 8:00-5:00 Babies seen by providers at [facility] Does NOT accept Medicaid Only accepting babies of parents who  are patients Please call early in hospitalization for appointment (limited availability) [facility] Clark, MD; Frye, MD; Kelleher, MD; Mack, NP; Miller, MD; O'Keller, MD; Patterson, NP; Pudlo, MD; Puzio, MD; Thomas, MD; Tucker, MD; Twiselton, MD [address], [place], [place] [address] [phone] Mon-Fri 8:00-5:00, Sat 9:00-12:00 Providers come to see babies at [facility] Does NOT accept Medicaid  [place] ([address]) [facility] providers accepting new patients: Brake, NP; Wharton, PA [address], [place], [place] [address] [phone] Mon-Fri 8:00-5:00 Babies seen by providers at [facility] Does NOT accept Medicaid Only accepting babies of parents who are patients Please call early in hospitalization for appointment (limited availability) [facility] Gay, MD; Quinlan, MD [address] [place], [place] [address] [phone] (press 1 to schedule appointment) Mon-Fri 8:00-5:00 Providers come to see babies at [facility] Does NOT accept Medicaid [facility] Mazer, MD [address] [place], [place] [address] [phone] Mon-Fri 8:30-5:00 (lunch 12:30-1:00), extended hours by appointment only Wed 5:00-6:30 Babies seen by [facility] providers Accepting Medicaid [facility] Banks, MD; Jordan, MD; Koberlein, MD [address], [place], [place] [address] [phone] Mon-Fri 8:00-5:00 Babies seen by [facility] providers Does NOT accept Medicaid [facility] Parker, MD; Hunter, MD; Wallace, DO [address] [place], [place] [address] [phone] Mon-Fri 8:00-5:00 Babies seen by [facility] providers Does NOT accept Medicaid [facility] Brandon, PA; Brecken, PA; Christy, NP; Dees, MD; DeClaire, MD; DeWeese, MD; Hansen, NP; Mills, NP; Parrish, NP; Smoot, NP; Summer, MD; Vapne,  MD [address] [place], [place] [address] [phone] Mon-Fri 8:30-5:00, Sat 10:00-1:00 Providers come to see babies at [facility] Does NOT accept Medicaid Free prenatal information session Tuesdays at 4:45pm [facility] Bouska, MD; Gordon, PA; Jeffery, PA; Weber, PA [address] [place] [address] [phone] Mon-Fri 7:30-5:30 Babies seen by [facility] providers [facility] [address], [place], [place]  [address] [phone]   Fax - [phone]  [place] ([address] & [address]) [facility] [place] [address] [place], [place] [address] [phone] Mon-Thur 8:00-6:00 Providers come to see babies at [facility] Accepting Medicaid [facility] Anderson, NP; Badger, MD; Beal, PA; Spencer, PA [address] [place], [place] [address] [phone] Mon-Thur 7:30-7:30, Fri 7:30-4:30 Babies seen by [facility] providers Accepting Medicaid [facility] Agbuya, MD; Klett, NP; Romgoolam, MD [address], [place], [place] [address] [phone] Mon-Fri 8:30-5:00, Sat 8:30-12:00 Providers come to see babies at [facility] Accepting Medicaid Must have "Meet & Greet" appointment at office prior to delivery [facility] ([facility]) McCord, MD; Wallace, MD; Wood, MD [address], [place], [place] [address] [phone] Mon-Wed 8:00-6:00, Thur-Fri 8:00-5:00, Sat 9:00-12:00 Providers come to see babies at [facility] Does NOT accept Medicaid Only accepting siblings of current patients [facility]  [address], [place], [place]  [address] [phone]   Fax - [phone] [facility] [address], [place], [place]  [address] [phone]   Fax -  [phone]  [place] ([address] & [address]) [facility] Cirigliano, DO; Matthews, DO [address] [place], [place] [address] [phone] Mon-Fri 7:00-5:00 Babies seen by [facility] providers Does NOT accept Medicaid [facility] Briscoe, MD; Howley, PA; Moreira, PA [address], [place], [place] [address] [phone] Mon-Fri 8:00-5:00 Babies seen by [facility] providers Accepting Medicaid [facility] Boyd, MD; Church, PA; Jones, NP; Osborn, PA [address], ,  [address] [phone] Mon-Fri 8:00-5:00 Babies seen by providers at [facility] Accepting Medicaid  [place] ([address]) [facility] [place] Wendling, DO [address] [place], [place] [address] [phone] Mon-Fri 8:00-5:00 Babies seen by [facility] providers Does NOT accept Medicaid Limited availability, please call early in hospitalization to schedule follow-up [facility] Calderon, PA; Cummings, MD; Dillard, MD; Martin, PA; Olson, MD; VanDeven, PA [address], [place], [place] [address] [phone] Mon-Fri 8:30-5:00, Sat 9:00-12:00 Babies seen by providers at [facility] Accepting Medicaid Please register online then schedule online or call office [email or web address] Hunter, NP; Kumar, MD; Martin Rogers, PA [address], [place], [place] [address] [phone] Mon-Fri 8:00-5:00 Babies seen by providers at [facility] Accepting Medicaid [facility] ([facility]) Clitherall, MD; Kristi Fleenor, NP; West, MD [address], [place], [place] [address] [phone] Mon-Fri 8:00-5:30, Sat&Sun by appointment (phones open at  8:30) Babies seen by [facility] providers Accepting Medicaid Must be a first-time baby or sibling of current patient [facility] [place]  [address], [place], [place]  [address] [phone]   Fax - [phone]  [place] ([address]) [facility] Brown, PA; Cowen, PA; Rice, MD; Helton, PA; Spry, MD [address] [place], [place] [address] [phone] Mon-Thur 8:00-7:00, Fri 8:00-5:00, Sat 8:00-12:00, Sun 9:00-12:00 Babies seen by [facility] providers Accepting Medicaid [facility] Coe-Goins, MD; Marshall, MD; Pierre-Louis, MD [address], [place], [place] [address] [phone] Mon-Thur 8:00-5:00 Babies seen by providers at [facility] Accepting Medicaid [facility] Bratton, MD; Coe-Goins, MD; Hayes, MD; Lewis, MD; List, MD; Lott, MD; Marshall, MD; Moran, MD; O'Neal, MD; Pierre-Louis, MD; Pitonzo, MD; Scholer, MD; Spangle, MD [address] [place], [place] [address] [phone] Mon-Fri 8:00-5:30, Sat (Oct.-Mar.) 9:00-1:00 Babies seen by providers at [facility] Accepting Medicaid Must fill out new patient packet, available online at [email or web address] [facility] ([facility]) Friddle, NP; Harris, NP; Kelly, NP; Logan, MD; Melvin, PA; Poth, MD; Ramadoss, MD; Stanton, NP [address], [place], [place] [address] [phone] Mon-Thur 8:00-5:30, Fri 8:00-5:00 Babies seen by providers at [facility] Accepting Medicaid  [place] ([address]) [facility] [place], PA; , MD; Pickard, MD; Tapia, PA [address], [place], [place] [address] [phone] Mon-Fri 8:00-5:00 Babies seen by providers at [facility] Accepting Medicaid   [place] ([address]) [facility] [place] Masneri, DO; Meyers, MD; Nelson, PA [address], [place], [place] [address] [phone] Mon-Fri 8:00-5:00 Babies seen by providers at [facility] Does NOT accept Medicaid Limited appointment availability, please call early in hospitalization  [facility] [place] Kunedd, DO; McGowen, MD [address], [place], [place] [address] [phone] Mon-Fri 8:00-5:00 Babies seen by [facility] providers Does NOT accept Medicaid [facility] [place] Cameron, MD; MacDonald, MD; Michaels, PA; Nayak, MD [address] [place] [address], [place], [place] [address] [phone] Mon-Fri 8:00-5:00 After hours clinic ([address] [place],  [address]) [phone] Mon-Fri 5:00-8:00, Sat 12:00-6:00, Sun 10:00-4:00 Babies seen by [facility] providers Accepting Medicaid [facility] [place] [address], [place], [place]  [address] [phone]   Fax - [phone]  [place] ([address]) [facility] [place] [facility] Andy, MD [address], [place], [place] [address] [phone] Mon-Fri 8:00-5:00 Babies seen by [facility] providers Does NOT accept Medicaid [facility] [place] ([facility] [place]) Eksir, MD [address], [place], [place] [address] [phone] Mon-Thur 8:00-7:00, Fri 8:00-5:00, Sat 8:00-12:00 Babies seen by providers at [facility] Accepting Medicaid - but does not have vaccinations in office (must be received elsewhere) Limited availability, please call early in hospitalization  [place] ([address]) [facility]  Charlene Flemming, MD [address], [place] [address] [phone]  Fax [phone]  [facility]  Kimberly Newton, MD, Annamarie Streilein, PA, Carla Hampton, PA [address] [place], [place]  [address] [phone] [facility]  [address], [place], [place] [address] [phone] [address], [place], [place] [address] [phone] (West Office)  [facility] [address], [place], [place] [address] [phone] [facility] [address] [place], [place], [place] [address] [phone] [facility] [address], [place], [place] [address] [phone] [facility] [address], [place], [place] [address] [phone] [facility] [address], [place], [place] [address] [phone] [facility] [address], [place], [place] [address] [phone] [facility]  [address], [place], [place] [address] [phone] Dr[facility] [address] [place] [address], [place], [place] [address] [phone] [facility] [address], [place], [place] [address] [phone] [facility] [address] [place] [address], [place],  [address] [phone]  

## 2021-11-02 LAB — GC/CHLAMYDIA PROBE AMP (~~LOC~~) NOT AT ARMC
Chlamydia: NEGATIVE
Comment: NEGATIVE
Comment: NORMAL
Neisseria Gonorrhea: NEGATIVE

## 2021-11-05 LAB — CULTURE, BETA STREP (GROUP B ONLY): Strep Gp B Culture: NEGATIVE

## 2021-11-11 ENCOUNTER — Inpatient Hospital Stay (HOSPITAL_COMMUNITY)
Admission: AD | Admit: 2021-11-11 | Discharge: 2021-11-12 | Disposition: A | Discharge status: Home / Self Care | Payer: Medicaid Other | Attending: Obstetrics and Gynecology | Admitting: Obstetrics and Gynecology

## 2021-11-11 DIAGNOSIS — Z3A39 39 weeks gestation of pregnancy: Secondary | ICD-10-CM | POA: Insufficient documentation

## 2021-11-11 DIAGNOSIS — O368130 Decreased fetal movements, third trimester, not applicable or unspecified: Secondary | ICD-10-CM | POA: Insufficient documentation

## 2021-11-11 DIAGNOSIS — Z3689 Encounter for other specified antenatal screening: Secondary | ICD-10-CM

## 2021-11-11 DIAGNOSIS — O471 False labor at or after 37 completed weeks of gestation: Secondary | ICD-10-CM | POA: Insufficient documentation

## 2021-11-11 DIAGNOSIS — O479 False labor, unspecified: Secondary | ICD-10-CM

## 2021-11-12 ENCOUNTER — Encounter (HOSPITAL_COMMUNITY): Payer: Self-pay | Admitting: Obstetrics and Gynecology

## 2021-11-12 DIAGNOSIS — Z3A39 39 weeks gestation of pregnancy: Secondary | ICD-10-CM | POA: Diagnosis not present

## 2021-11-12 DIAGNOSIS — O368130 Decreased fetal movements, third trimester, not applicable or unspecified: Secondary | ICD-10-CM | POA: Diagnosis not present

## 2021-11-12 DIAGNOSIS — O471 False labor at or after 37 completed weeks of gestation: Secondary | ICD-10-CM | POA: Diagnosis not present

## 2021-11-12 DIAGNOSIS — Z3689 Encounter for other specified antenatal screening: Secondary | ICD-10-CM | POA: Diagnosis not present

## 2021-11-12 NOTE — MAU Note (Signed)
Carla Stevens is a 21 y.o. at [redacted]w[redacted]d here in MAU reporting: Pt feeling ctx, 1 per hour. Has had less baby movement since 6pm.  LMP:  Onset of complaint: 11/11/21 Pain score: 0/10  Vitals:   11/12/21 0010  BP: 121/70  Pulse: 72  Resp: 16  Temp: 98.7 F (37.1 C)  SpO2: 100%     FHT:115 Lab orders placed from triage:

## 2021-11-12 NOTE — MAU Provider Note (Signed)
History     [id number]  Arrival date and time: 11/11/21 2349    Chief Complaint  Patient presents with   Contractions   Decreased Fetal Movement     HPI Mieko Kneebone is a 21 y.o. at [redacted]w[redacted]d who presents for contractions & decreased fetal movement. Reports contractions since 6 pm. Can't tell how frequently they occur. States they are all painful. Mainly feels pain when she's up & walking around, but when sitting down she's not feeling pain.  Has noticed a decrease in movement since 6 pm which has gotten less since 10 pm. Since 6 pm has felt the baby move 5 times.    Vaginal bleeding: No LOF: No Fetal Movement: No Contractions: Yes   OB History     Gravida  1   Para  0   Term  0   Preterm  0   AB  0   Living  0      SAB  0   IAB  0   Ectopic  0   Multiple  0   Live Births  0           Past Medical History:  Diagnosis Date   Medical history non-contributory     Past Surgical History:  Procedure Laterality Date   NO PAST SURGERIES      Family History  Problem Relation Age of Onset   Asthma Mother    Cancer Maternal Grandmother    Kidney disease Maternal Grandfather     Social History   Socioeconomic History   Marital status: Single    Spouse name: Not on file   Number of children: Not on file   Years of education: Not on file   Highest education level: Not on file  Occupational History   Not on file  Tobacco Use   Smoking status: Never    Passive exposure: Yes   Smokeless tobacco: Never  Vaping Use   Vaping Use: Never used  Substance and Sexual Activity   Alcohol use: No   Drug use: No   Sexual activity: Not Currently  Other Topics Concern   Not on file  Social History Narrative   Not on file   Social Determinants of Health   Financial Resource Strain: Not on file  Food Insecurity: No Food Insecurity   Worried About Running Out of Food in the Last Year: Never true   Ran Out of Food in the Last Year: Never true   Transportation Needs: No Transportation Needs   Lack of Transportation (Medical): No   Lack of Transportation (Non-Medical): No  Physical Activity: Not on file  Stress: Not on file  Social Connections: Not on file  Intimate Partner Violence: Not on file    Allergies  Allergen Reactions   Watermelon [Citrullus Vulgaris] Hives    No current facility-administered medications on file prior to encounter.   Current Outpatient Medications on File Prior to Encounter  Medication Sig Dispense Refill   Blood Pressure Monitoring (BLOOD PRESSURE KIT) DEVI 1 Device by Does not apply route as needed. 1 each 0   Misc. Devices (GOJJI WEIGHT SCALE) MISC 1 Device by Does not apply route as needed. (Patient not taking: Reported on 05/18/2021) 1 each 0   Prenatal Vit-Fe Fumarate-FA (MULTIVITAMIN-PRENATAL) 27-0.8 MG TABS tablet Take 1 tablet by mouth daily at 12 noon.     [DISCONTINUED] Marilu Favre 150-35 MCG/24HR transdermal patch Place 1 patch onto the skin once a week.  ROS Pertinent positives and negative per HPI, all others reviewed and negative  Physical Exam   BP 117/72    Pulse 73    Temp 98.7 F (37.1 C) (Oral)    Resp 16    Ht $R'5\' 4"'rW$  (1.626 m)    Wt 70.6 kg    LMP 02/11/2021    SpO2 100%    BMI 26.73 kg/m   Patient Vitals for the past 24 hrs:  BP Temp Temp src Pulse Resp SpO2 Height Weight  11/12/21 0118 117/72 -- -- 73 -- -- -- --  11/12/21 0010 121/70 98.7 F (37.1 C) Oral 72 16 100 % $Rem'5\' 4"'AZqd$  (1.626 m) 70.6 kg    Physical Exam Vitals and nursing note reviewed.  Constitutional:      General: She is not in acute distress.    Appearance: Normal appearance.  Eyes:     General: No scleral icterus.    Conjunctiva/sclera: Conjunctivae normal.  Pulmonary:     Effort: Pulmonary effort is normal. No respiratory distress.  Neurological:     General: No focal deficit present.     Mental Status: She is alert.  Psychiatric:        Mood and Affect: Mood normal.        Behavior: Behavior  normal.     Cervical Exam Dilation: 1 Effacement (%): 60 Cervical Position: Middle Station: -3 Presentation: Vertex Exam by:: Wilhemena Durie RN   FHT Baseline 120, moderate variability, 15x15 accels, no decels Toco: irregular, Q5-12 minutes Cat: 1  Labs No results found for this or any previous visit (from the past 24 hour(s)).  Imaging No results found.  MAU Course  Procedures Lab Orders  No laboratory test(s) ordered today   No orders of the defined types were placed in this encounter.  Imaging Orders  No imaging studies ordered today    MDM Patient presents for contractions & decreased fetal movement.  Reactive NST Patient recorded 17 movements within the hour she was being monitored.  Assessment and Plan   1. False labor  -cervix unchanged while in MAU & patient not feeling her contractions  2. Decreased fetal movements in third trimester, single or unspecified fetus  -reactive NST -patient reports increased fetal movement since arriving to MAU  3. NST (non-stress test) reactive   4. [redacted] weeks gestation of pregnancy      Jorje Guild, NP 11/12/21 1:27 AM

## 2021-11-13 ENCOUNTER — Encounter (HOSPITAL_COMMUNITY): Payer: Self-pay | Admitting: Obstetrics and Gynecology

## 2021-11-13 ENCOUNTER — Inpatient Hospital Stay (HOSPITAL_COMMUNITY)
Admission: AD | Admit: 2021-11-13 | Discharge: 2021-11-13 | Disposition: A | Discharge status: Home / Self Care | Payer: Medicaid Other | Attending: Obstetrics and Gynecology | Admitting: Obstetrics and Gynecology

## 2021-11-13 ENCOUNTER — Other Ambulatory Visit: Payer: Self-pay

## 2021-11-13 DIAGNOSIS — O471 False labor at or after 37 completed weeks of gestation: Secondary | ICD-10-CM | POA: Insufficient documentation

## 2021-11-13 DIAGNOSIS — Z3A39 39 weeks gestation of pregnancy: Secondary | ICD-10-CM | POA: Insufficient documentation

## 2021-11-13 DIAGNOSIS — Z3402 Encounter for supervision of normal first pregnancy, second trimester: Secondary | ICD-10-CM

## 2021-11-13 DIAGNOSIS — O479 False labor, unspecified: Secondary | ICD-10-CM

## 2021-11-13 NOTE — MAU Note (Signed)
PT SAYS NO PAIN - BUT TIGHTENING IN ABD WAS HERE ON Sunday- 1 CM DENIES HSV GBS- NEG

## 2021-11-15 ENCOUNTER — Ambulatory Visit (INDEPENDENT_AMBULATORY_CARE_PROVIDER_SITE_OTHER): Payer: Medicaid Other | Admitting: Obstetrics and Gynecology

## 2021-11-15 ENCOUNTER — Other Ambulatory Visit: Payer: Self-pay

## 2021-11-15 VITALS — BP 114/64 | HR 67 | Wt 155.1 lb

## 2021-11-15 DIAGNOSIS — B9629 Other Escherichia coli [E. coli] as the cause of diseases classified elsewhere: Secondary | ICD-10-CM

## 2021-11-15 DIAGNOSIS — Z3A39 39 weeks gestation of pregnancy: Secondary | ICD-10-CM

## 2021-11-15 DIAGNOSIS — Z1612 Extended spectrum beta lactamase (ESBL) resistance: Secondary | ICD-10-CM

## 2021-11-15 DIAGNOSIS — N390 Urinary tract infection, site not specified: Secondary | ICD-10-CM

## 2021-11-16 ENCOUNTER — Encounter (HOSPITAL_COMMUNITY): Payer: Self-pay | Admitting: *Deleted

## 2021-11-16 ENCOUNTER — Telehealth (HOSPITAL_COMMUNITY): Payer: Self-pay | Admitting: *Deleted

## 2021-11-16 ENCOUNTER — Other Ambulatory Visit: Payer: Self-pay | Admitting: Advanced Practice Midwife

## 2021-11-16 ENCOUNTER — Encounter (HOSPITAL_COMMUNITY): Payer: Self-pay

## 2021-11-16 NOTE — Telephone Encounter (Signed)
Preadmission screen  

## 2021-11-17 NOTE — Progress Notes (Signed)
° °  PRENATAL VISIT NOTE  Subjective:  Carla Stevens is a 21 y.o. G1P0000 at [redacted]w[redacted]d being seen today for ongoing prenatal care.  She is currently monitored for the following issues for this low-risk pregnancy and has Encounter for supervision of normal first pregnancy in second trimester and UTI due to extended-spectrum beta lactamase (ESBL) producing Escherichia coli on their problem list.  Patient reports no complaints.  Contractions: Irritability. Vag. Bleeding: None.  Movement: Present. Denies leaking of fluid.   The following portions of the patient's history were reviewed and updated as appropriate: allergies, current medications, past family history, past medical history, past social history, past surgical history and problem list.   Objective:   Vitals:   11/15/21 1548  BP: 114/64  Pulse: 67  Weight: 155 lb 1.6 oz (70.4 kg)    Fetal Status: Fetal Heart Rate (bpm): 131 Fundal Height: 38 cm Movement: Present  Presentation: Vertex  General:  Alert, oriented and cooperative. Patient is in no acute distress.  Skin: Skin is warm and dry. No rash noted.   Cardiovascular: Normal heart rate noted  Respiratory: Normal respiratory effort, no problems with respiration noted  Abdomen: Soft, gravid, appropriate for gestational age.  Pain/Pressure: Present     Pelvic: Cervical exam deferred        Extremities: Normal range of motion.  Edema: Trace  Mental Status: Normal mood and affect. Normal behavior. Normal judgment and thought content.   Assessment and Plan:  Pregnancy: G1P0000 at [redacted]w[redacted]d 1. [redacted] weeks gestation of pregnancy D/w her and partner re: 41wk IOL and they are amenable to this. Will set up for 1wk ROB and nst/afi for post dates. Pt declines cx check today. Iol set up for 3/3 at 2345. GBS neg Birth conrol options d/w pt. ?depo   2. UTI due to extended-spectrum beta lactamase (ESBL) producing Escherichia coli Toc neg  Term labor symptoms and general obstetric precautions including  but not limited to vaginal bleeding, contractions, leaking of fluid and fetal movement were reviewed in detail with the patient. Please refer to After Visit Summary for other counseling recommendations.   No follow-ups on file.  Future Appointments  Date Time Provider Department Center  11/21/2021  2:55 PM Madlyn Frankel [facility]  11/24/2021 12:00 AM [facility] SCHED ROOM [facility] None    Frankford Bing, MD

## 2021-11-19 ENCOUNTER — Encounter (HOSPITAL_COMMUNITY): Payer: Self-pay | Admitting: Obstetrics and Gynecology

## 2021-11-19 ENCOUNTER — Inpatient Hospital Stay (HOSPITAL_COMMUNITY)
Admission: AD | Admit: 2021-11-19 | Discharge: 2021-11-22 | DRG: 805 | Disposition: A | Discharge status: Home / Self Care | Payer: Medicaid Other | Attending: Obstetrics and Gynecology | Admitting: Obstetrics and Gynecology

## 2021-11-19 ENCOUNTER — Other Ambulatory Visit: Payer: Self-pay

## 2021-11-19 ENCOUNTER — Inpatient Hospital Stay (HOSPITAL_COMMUNITY): Payer: Medicaid Other | Admitting: Anesthesiology

## 2021-11-19 ENCOUNTER — Inpatient Hospital Stay (HOSPITAL_COMMUNITY)
Admission: AD | Admit: 2021-11-19 | Discharge: 2021-11-19 | Disposition: A | Discharge status: Home / Self Care | Payer: Medicaid Other | Source: Home / Self Care | Attending: Family Medicine | Admitting: Family Medicine

## 2021-11-19 ENCOUNTER — Encounter (HOSPITAL_COMMUNITY): Payer: Self-pay | Admitting: Family Medicine

## 2021-11-19 DIAGNOSIS — Z3402 Encounter for supervision of normal first pregnancy, second trimester: Secondary | ICD-10-CM

## 2021-11-19 DIAGNOSIS — Z23 Encounter for immunization: Secondary | ICD-10-CM

## 2021-11-19 DIAGNOSIS — O480 Post-term pregnancy: Secondary | ICD-10-CM | POA: Diagnosis present

## 2021-11-19 DIAGNOSIS — O411230 Chorioamnionitis, third trimester, not applicable or unspecified: Secondary | ICD-10-CM | POA: Diagnosis present

## 2021-11-19 DIAGNOSIS — O1493 Unspecified pre-eclampsia, third trimester: Secondary | ICD-10-CM | POA: Insufficient documentation

## 2021-11-19 DIAGNOSIS — D62 Acute posthemorrhagic anemia: Secondary | ICD-10-CM | POA: Diagnosis not present

## 2021-11-19 DIAGNOSIS — Z3A40 40 weeks gestation of pregnancy: Secondary | ICD-10-CM | POA: Diagnosis not present

## 2021-11-19 DIAGNOSIS — Z20822 Contact with and (suspected) exposure to covid-19: Secondary | ICD-10-CM | POA: Diagnosis present

## 2021-11-19 DIAGNOSIS — O26893 Other specified pregnancy related conditions, third trimester: Secondary | ICD-10-CM | POA: Insufficient documentation

## 2021-11-19 DIAGNOSIS — O9081 Anemia of the puerperium: Secondary | ICD-10-CM | POA: Diagnosis not present

## 2021-11-19 DIAGNOSIS — R030 Elevated blood-pressure reading, without diagnosis of hypertension: Secondary | ICD-10-CM | POA: Insufficient documentation

## 2021-11-19 LAB — CBC
HCT: 35.8 % — ABNORMAL LOW (ref 36.0–46.0)
Hemoglobin: 11.4 g/dL — ABNORMAL LOW (ref 12.0–15.0)
MCH: 26.4 pg (ref 26.0–34.0)
MCHC: 31.8 g/dL (ref 30.0–36.0)
MCV: 82.9 fL (ref 80.0–100.0)
Platelets: 234 10*3/uL (ref 150–400)
RBC: 4.32 MIL/uL (ref 3.87–5.11)
RDW: 15.6 % — ABNORMAL HIGH (ref 11.5–15.5)
WBC: 10.3 10*3/uL (ref 4.0–10.5)
nRBC: 0 % (ref 0.0–0.2)

## 2021-11-19 LAB — RESP PANEL BY RT-PCR (FLU A&B, COVID) ARPGX2
Influenza A by PCR: NEGATIVE
Influenza B by PCR: NEGATIVE
SARS Coronavirus 2 by RT PCR: NEGATIVE

## 2021-11-19 LAB — COMPREHENSIVE METABOLIC PANEL
ALT: 12 U/L (ref 0–44)
AST: 22 U/L (ref 15–41)
Albumin: 2.8 g/dL — ABNORMAL LOW (ref 3.5–5.0)
Alkaline Phosphatase: 170 U/L — ABNORMAL HIGH (ref 38–126)
Anion gap: 9 (ref 5–15)
BUN: <5 mg/dL — ABNORMAL LOW (ref 6–20)
CO2: 23 mmol/L (ref 22–32)
Calcium: 9 mg/dL (ref 8.9–10.3)
Chloride: 104 mmol/L (ref 98–111)
Creatinine, Ser: 0.66 mg/dL (ref 0.44–1.00)
GFR, Estimated: >60 mL/min (ref >60)
Glucose, Bld: 84 mg/dL (ref 70–99)
Potassium: 3.7 mmol/L (ref 3.5–5.1)
Sodium: 136 mmol/L (ref 135–145)
Total Bilirubin: <0.1 mg/dL — ABNORMAL LOW (ref 0.3–1.2)
Total Protein: 6.5 g/dL (ref 6.5–8.1)

## 2021-11-19 LAB — PROTEIN / CREATININE RATIO, URINE
Creatinine, Urine: 53.42 mg/dL
Protein Creatinine Ratio: 0.13 mg/mg{Cre} (ref 0.00–0.15)
Total Protein, Urine: 7 mg/dL

## 2021-11-19 LAB — TYPE AND SCREEN
ABO/RH(D): A POS
Antibody Screen: NEGATIVE

## 2021-11-19 MED ORDER — LACTATED RINGERS IV SOLN: INTRAVENOUS | Status: DC

## 2021-11-19 MED ORDER — PHENYLEPHRINE 40 MCG/ML (10ML) SYRINGE FOR IV PUSH (FOR BLOOD PRESSURE SUPPORT)
80.0000 ug | PREFILLED_SYRINGE | INTRAVENOUS | Status: DC | PRN
  Filled 2021-11-19 (×2): qty 10

## 2021-11-19 MED ORDER — OXYTOCIN-SODIUM CHLORIDE 30-0.9 UT/500ML-% IV SOLN
2.5000 [IU]/h | INTRAVENOUS | Status: DC
  Filled 2021-11-19: qty 500

## 2021-11-19 MED ORDER — FENTANYL CITRATE (PF) 100 MCG/2ML IJ SOLN
100.0000 ug | INTRAMUSCULAR | Status: DC | PRN
  Administered 2021-11-19: 100 ug via INTRAVENOUS
  Filled 2021-11-19: qty 2

## 2021-11-19 MED ORDER — ACETAMINOPHEN 325 MG PO TABS
650.0000 mg | ORAL_TABLET | ORAL | Status: DC | PRN
  Administered 2021-11-20: 650 mg via ORAL
  Filled 2021-11-19: qty 2

## 2021-11-19 MED ORDER — DIPHENHYDRAMINE HCL 50 MG/ML IJ SOLN
12.5000 mg | INTRAMUSCULAR | Status: DC | PRN
  Administered 2021-11-20: 12.5 mg via INTRAVENOUS
  Filled 2021-11-19: qty 1

## 2021-11-19 MED ORDER — LACTATED RINGERS IV SOLN
500.0000 mL | Freq: Once | INTRAVENOUS | Status: AC
  Administered 2021-11-19: 500 mL via INTRAVENOUS

## 2021-11-19 MED ORDER — ONDANSETRON HCL 4 MG/2ML IJ SOLN
4.0000 mg | Freq: Four times a day (QID) | INTRAMUSCULAR | Status: DC | PRN
  Administered 2021-11-20: 4 mg via INTRAVENOUS
  Filled 2021-11-19: qty 2

## 2021-11-19 MED ORDER — LACTATED RINGERS IV SOLN
500.0000 mL | INTRAVENOUS | Status: DC | PRN
  Administered 2021-11-20 (×3): 500 mL via INTRAVENOUS

## 2021-11-19 MED ORDER — TERBUTALINE SULFATE 1 MG/ML IJ SOLN: 0.2500 mg | Freq: Once | INTRAMUSCULAR | Status: DC | PRN

## 2021-11-19 MED ORDER — MISOPROSTOL 25 MCG QUARTER TABLET: 25.0000 ug | ORAL_TABLET | ORAL | Status: DC | PRN

## 2021-11-19 MED ORDER — LIDOCAINE HCL (PF) 1 % IJ SOLN
INTRAMUSCULAR | Status: DC | PRN
  Administered 2021-11-19 (×2): 4 mL via EPIDURAL

## 2021-11-19 MED ORDER — PHENYLEPHRINE 40 MCG/ML (10ML) SYRINGE FOR IV PUSH (FOR BLOOD PRESSURE SUPPORT)
80.0000 ug | PREFILLED_SYRINGE | INTRAVENOUS | Status: DC | PRN
  Filled 2021-11-19: qty 10

## 2021-11-19 MED ORDER — FENTANYL-BUPIVACAINE-NACL 0.5-0.125-0.9 MG/250ML-% EP SOLN
12.0000 mL/h | EPIDURAL | Status: DC | PRN
  Administered 2021-11-19 – 2021-11-20 (×3): 12 mL/h via EPIDURAL
  Filled 2021-11-19 (×3): qty 250

## 2021-11-19 MED ORDER — SOD CITRATE-CITRIC ACID 500-334 MG/5ML PO SOLN: 30.0000 mL | ORAL | Status: DC | PRN

## 2021-11-19 MED ORDER — FENTANYL CITRATE (PF) 100 MCG/2ML IJ SOLN: 50.0000 ug | INTRAMUSCULAR | Status: DC | PRN

## 2021-11-19 MED ORDER — LIDOCAINE HCL (PF) 1 % IJ SOLN
30.0000 mL | INTRAMUSCULAR | Status: AC | PRN
  Administered 2021-11-20: 30 mL via SUBCUTANEOUS
  Filled 2021-11-19: qty 30

## 2021-11-19 MED ORDER — EPHEDRINE 5 MG/ML INJ
10.0000 mg | INTRAVENOUS | Status: DC | PRN
  Filled 2021-11-19: qty 2

## 2021-11-19 MED ORDER — OXYTOCIN BOLUS FROM INFUSION
333.0000 mL | Freq: Once | INTRAVENOUS | Status: AC
  Administered 2021-11-20: 333 mL via INTRAVENOUS

## 2021-11-19 MED ORDER — OXYTOCIN-SODIUM CHLORIDE 30-0.9 UT/500ML-% IV SOLN
1.0000 m[IU]/min | INTRAVENOUS | Status: DC
  Administered 2021-11-19: 2 m[IU]/min via INTRAVENOUS

## 2021-11-19 NOTE — MAU Provider Note (Signed)
History     CSN: [id number]  Arrival date and time: 11/19/21 0858   None     Chief Complaint  Patient presents with   Contractions   Patient came in today due to increase in contractions.She says that she has been having contractions since 0746 this morning and felt the sensation of having the urge to have a bowel movement. She said she went back to sleep and around 8 AM started having contractions every few minutes and came to the MAU after this.  Michela Pitcher that she denies any leaking of fluid or vaginal bleeding.  Has been having positive fetal movement.  Denies any headache, right upper quadrant pain, shortness of breath or chest pain.  Says she does have some swelling in her legs but denies any calf pain.     OB History     Gravida  1   Para  0   Term  0   Preterm  0   AB  0   Living  0      SAB  0   IAB  0   Ectopic  0   Multiple  0   Live Births  0           Past Medical History:  Diagnosis Date   Medical history non-contributory     Past Surgical History:  Procedure Laterality Date   NO PAST SURGERIES      Family History  Problem Relation Age of Onset   Asthma Mother    Healthy Father    Cancer Maternal Grandmother    Kidney disease Maternal Grandfather     Social History   Tobacco Use   Smoking status: Never    Passive exposure: Yes   Smokeless tobacco: Never  Vaping Use   Vaping Use: Never used  Substance Use Topics   Alcohol use: No   Drug use: No    Allergies:  Allergies  Allergen Reactions   Watermelon [Citrullus Vulgaris] Hives    Medications Prior to Admission  Medication Sig Dispense Refill Last Dose   Prenatal Vit-Fe Fumarate-FA (MULTIVITAMIN-PRENATAL) 27-0.8 MG TABS tablet Take 1 tablet by mouth daily at 12 noon.   11/18/2021   Blood Pressure Monitoring (BLOOD PRESSURE KIT) DEVI 1 Device by Does not apply route as needed. 1 each 0    Misc. Devices (GOJJI WEIGHT SCALE) MISC 1 Device by Does not apply route as needed.  (Patient not taking: Reported on 05/18/2021) 1 each 0     Review of Systems  Eyes:  Negative for visual disturbance.  Respiratory:  Negative for chest tightness and shortness of breath.   Cardiovascular:  Negative for chest pain.  Gastrointestinal:  Negative for abdominal pain.  Neurological:  Negative for headaches.  Psychiatric/Behavioral:  Negative for confusion.   Physical Exam   Blood pressure 121/83, pulse 76, temperature 98.2 F (36.8 C), resp. rate 20, last menstrual period 02/11/2021, SpO2 100 %.  Patient Vitals for the past 24 hrs:  BP Temp Pulse Resp SpO2  11/19/21 1132 121/83 -- 76 -- --  11/19/21 1100 122/78 -- 78 -- --  11/19/21 1045 124/83 -- 76 -- --  11/19/21 1030 123/88 -- 77 -- --  11/19/21 1015 121/85 -- 78 -- --  11/19/21 1008 128/86 -- 68 -- --  11/19/21 0952 -- -- -- -- 100 %  11/19/21 0945 122/89 -- 73 -- 99 %  11/19/21 0930 131/88 -- 69 -- 100 %  11/19/21 0913 (!) 136/96 98.2 F (36.8  C) 81 20 100 %   NST:  Baseline: 140s  Variability: moderate variability Accelerations: +accels  Decelerations: none Contractions: every 3 to 4 minutes   Physical Exam Constitutional:      Appearance: Normal appearance. She is not toxic-appearing.  Cardiovascular:     Rate and Rhythm: Normal rate and regular rhythm.     Pulses: Normal pulses.     Heart sounds: Normal heart sounds.  Pulmonary:     Effort: Pulmonary effort is normal.     Breath sounds: Normal breath sounds. No wheezing or rales.  Abdominal:     Tenderness: There is no abdominal tenderness.  Musculoskeletal:        General: No swelling or tenderness.  Skin:    General: Skin is warm and dry.  Neurological:     Mental Status: She is alert.  Psychiatric:        Mood and Affect: Mood normal.    MAU Course  Procedures  MDM Patient presented at term with elevated blood pressure to 136/96.  Pre-E labs were obtained and was negative (P/C 0.13, LFTs and platelets wnl)  Assessment and Plan   #Elevated BP reading at term 1 blood pressure reading of 136/96 initially, was normotensive after, had elevated BP to 130s/90s during a contraction.  Patient currently asymptomatic. Pre E labs negative. -Return precautions discussed  #Term pregnancy 40 weeks Cervical change from 1 to 1.5 dilation in 2 hours. Contracting every 3-4 minutes. Discussed options with patient in depth of observation vs. Returning home with precautions.  -Patient amenable to laboring at home, return precautions discussed in depth  [facility] 11/19/2021, 11:47 AM

## 2021-11-19 NOTE — Progress Notes (Signed)
Labor Progress Note Carla Stevens is a 21 y.o. G1P0000 at [redacted]w[redacted]d presented for early labor.   S: Feeling comfortable with epidural in place. Just placed about one hour ago.   O:  BP 114/87    Pulse 72    Temp 98.2 F (36.8 C) (Oral)    Resp 20    LMP 02/11/2021    SpO2 100%  EFM: 130/mod/15x15/none  CVE: Dilation: 3 Effacement (%): 90 Station: -2 Presentation: Vertex Exam by:: Mellissa Kohut, RN   A&P: 20 y.o. G1P0000 [redacted]w[redacted]d  #Labor: Unchanged for several hours. Discussed continued expectant management vs starting pit, she would like to proceed with pit. 2x2 started.  #Pain: Epidural  #FWB: Cat I  #GBS negative  Allayne Stack, DO 10:56 PM

## 2021-11-19 NOTE — MAU Note (Signed)
Presents with onset of regular ctxs that began this morning @ 0746.  Denies LOF or VB.  Endorses +FM.

## 2021-11-19 NOTE — Progress Notes (Signed)
CNM will return RN call.

## 2021-11-19 NOTE — MAU Note (Signed)
Presents stating she continues to have regular painful ctxs.  Denies VB or LOF.  Reports +FM.

## 2021-11-19 NOTE — Discharge Instructions (Signed)
Please return if you are having any headaches/vision changes.

## 2021-11-19 NOTE — Anesthesia Preprocedure Evaluation (Signed)
Anesthesia Evaluation  Patient identified by MRN, date of birth, ID band Patient awake    Reviewed: Allergy & Precautions, Patient's Chart, lab work & pertinent test results  History of Anesthesia Complications Negative for: history of anesthetic complications  Airway Mallampati: II  TM Distance: >3 FB Neck ROM: Full    Dental no notable dental hx.    Pulmonary neg pulmonary ROS,    Pulmonary exam normal        Cardiovascular negative cardio ROS Normal cardiovascular exam     Neuro/Psych negative neurological ROS  negative psych ROS   GI/Hepatic negative GI ROS, Neg liver ROS,   Endo/Other  negative endocrine ROS  Renal/GU negative Renal ROS  negative genitourinary   Musculoskeletal negative musculoskeletal ROS (+)   Abdominal   Peds  Hematology negative hematology ROS (+)   Anesthesia Other Findings Day of surgery medications reviewed with patient.  Reproductive/Obstetrics (+) Pregnancy                             Anesthesia Physical Anesthesia Plan  ASA: 2  Anesthesia Plan: Epidural   Post-op Pain Management:    Induction:   PONV Risk Score and Plan: Treatment may vary due to age or medical condition  Airway Management Planned: Natural Airway  Additional Equipment: Fetal Monitoring  Intra-op Plan:   Post-operative Plan:   Informed Consent: I have reviewed the patients History and Physical, chart, labs and discussed the procedure including the risks, benefits and alternatives for the proposed anesthesia with the patient or authorized representative who has indicated his/her understanding and acceptance.       Plan Discussed with:   Anesthesia Plan Comments:         Anesthesia Quick Evaluation  

## 2021-11-19 NOTE — Anesthesia Procedure Notes (Signed)
Epidural Patient location during procedure: OB Start time: 11/19/2021 8:18 PM End time: 11/19/2021 8:21 PM  Staffing Anesthesiologist: Kaylyn Layer, MD Performed: anesthesiologist   Preanesthetic Checklist Completed: patient identified, IV checked, risks and benefits discussed, monitors and equipment checked, pre-op evaluation and timeout performed  Epidural Patient position: sitting Prep: DuraPrep and site prepped and draped Patient monitoring: continuous pulse ox, blood pressure and heart rate Approach: midline Location: L3-L4 Injection technique: LOR air  Needle:  Needle type: Tuohy  Needle gauge: 17 G Needle length: 9 cm Needle insertion depth: 5 cm Catheter type: closed end flexible Catheter size: 19 Gauge Catheter at skin depth: 10 cm Test dose: negative and Other (1% lidocaine)  Assessment Events: blood not aspirated, injection not painful, no injection resistance, no paresthesia and negative IV test  Additional Notes Patient identified. Risks, benefits, and alternatives discussed with patient including but not limited to bleeding, infection, nerve damage, paralysis, failed block, incomplete pain control, headache, blood pressure changes, nausea, vomiting, reactions to medication, itching, and postpartum back pain. Confirmed with bedside nurse the patient's most recent platelet count. Confirmed with patient that they are not currently taking any anticoagulation, have any bleeding history, or any family history of bleeding disorders. Patient expressed understanding and wished to proceed. All questions were answered. Sterile technique was used throughout the entire procedure. Please see nursing notes for vital signs.   Crisp LOR on first pass. Test dose was given through epidural catheter and negative prior to continuing to dose epidural or start infusion. Warning signs of high block given to the patient including shortness of breath, tingling/numbness in hands, complete  motor block, or any concerning symptoms with instructions to call for help. Patient was given instructions on fall risk and not to get out of bed. All questions and concerns addressed with instructions to call with any issues or inadequate analgesia.  Reason for block:procedure for pain

## 2021-11-19 NOTE — H&P (Signed)
OBSTETRIC ADMISSION HISTORY AND PHYSICAL  Carla Stevens is a 21 y.o. female G1P0000 with IUP at [redacted]w[redacted]d presenting for labor. She reports +FMs. No LOF, VB, blurry vision, headaches, peripheral edema, or RUQ pain. She plans on breastfeeding. She requests POPs for birth control.  Dating: By LMP --->  Estimated Date of Delivery: 11/18/21  Sono:    '@[redacted]w[redacted]d'$ , normal anatomy, 495g, 53%ile  Prenatal History/Complications: -UTI -post dates  Past Medical History: Past Medical History:  Diagnosis Date   Medical history non-contributory     Past Surgical History: Past Surgical History:  Procedure Laterality Date   NO PAST SURGERIES      Obstetrical History: OB History     Gravida  1   Para  0   Term  0   Preterm  0   AB  0   Living  0      SAB  0   IAB  0   Ectopic  0   Multiple  0   Live Births  0           Social History: Social History   Socioeconomic History   Marital status: Single    Spouse name: Not on file   Number of children: Not on file   Years of education: Not on file   Highest education level: Not on file  Occupational History   Not on file  Tobacco Use   Smoking status: Never    Passive exposure: Yes   Smokeless tobacco: Never  Vaping Use   Vaping Use: Never used  Substance and Sexual Activity   Alcohol use: No   Drug use: No   Sexual activity: Not Currently  Other Topics Concern   Not on file  Social History Narrative   Not on file   Social Determinants of Health   Financial Resource Strain: Not on file  Food Insecurity: No Food Insecurity   Worried About Running Out of Food in the Last Year: Never true   Ran Out of Food in the Last Year: Never true  Transportation Needs: No Transportation Needs   Lack of Transportation (Medical): No   Lack of Transportation (Non-Medical): No  Physical Activity: Not on file  Stress: Not on file  Social Connections: Not on file    Family History: Family History  Problem Relation Age of  Onset   Asthma Mother    Healthy Father    Cancer Maternal Grandmother    Kidney disease Maternal Grandfather     Allergies: Allergies  Allergen Reactions   Watermelon [Citrullus Vulgaris] Hives    Medications Prior to Admission  Medication Sig Dispense Refill Last Dose   Blood Pressure Monitoring (BLOOD PRESSURE KIT) DEVI 1 Device by Does not apply route as needed. 1 each 0    Misc. Devices (GOJJI WEIGHT SCALE) MISC 1 Device by Does not apply route as needed. (Patient not taking: Reported on 05/18/2021) 1 each 0    Prenatal Vit-Fe Fumarate-FA (MULTIVITAMIN-PRENATAL) 27-0.8 MG TABS tablet Take 1 tablet by mouth daily at 12 noon.        Review of Systems:  All systems reviewed and negative except as stated in HPI  PE: Blood pressure 127/72, pulse 83, temperature 98.2 F (36.8 C), temperature source Oral, resp. rate 20, last menstrual period 02/11/2021, SpO2 99 %. General appearance: alert, cooperative, and moderate distress Lungs: regular rate and effort Heart: regular rate  Abdomen: soft, non-tender Extremities: Homans sign is negative, no sign of DVT Presentation: cephalic EFM: 861  bpm, mod variability, + accels, no decels Toco: 2-4 Dilation: 3 Effacement (%): 80 Station: -2 Exam by:: Truitt Leep, RNC  Prenatal labs: ABO, Rh: A/Positive/-- (08/04 1641) Antibody: Negative (08/04 1641) Rubella: 3.50 (08/04 1641) RPR: Non Reactive (12/15 0840)  HBsAg: Negative (08/04 1641)  HIV: Non Reactive (12/15 0840)  GBS: Negative/-- (02/09 1143)  2 hr GTT nml  Prenatal Transfer Tool  Maternal Diabetes: No Genetic Screening: Normal Maternal Ultrasounds/Referrals: Normal Fetal Ultrasounds or other Referrals:  None Maternal Substance Abuse:  No Significant Maternal Medications:  None Significant Maternal Lab Results: Group B Strep negative  Results for orders placed or performed during the hospital encounter of 11/19/21 (from the past 24 hour(s))  Protein / creatinine ratio,  urine   Collection Time: 11/19/21 10:09 AM  Result Value Ref Range   Creatinine, Urine 53.42 mg/dL   Total Protein, Urine 7 mg/dL   Protein Creatinine Ratio 0.13 0.00 - 0.15 mg/mg[Cre]  CBC   Collection Time: 11/19/21 10:19 AM  Result Value Ref Range   WBC 10.3 4.0 - 10.5 K/uL   RBC 4.32 3.87 - 5.11 MIL/uL   Hemoglobin 11.4 (L) 12.0 - 15.0 g/dL   HCT 35.8 (L) 36.0 - 46.0 %   MCV 82.9 80.0 - 100.0 fL   MCH 26.4 26.0 - 34.0 pg   MCHC 31.8 30.0 - 36.0 g/dL   RDW 15.6 (H) 11.5 - 15.5 %   Platelets 234 150 - 400 K/uL   nRBC 0.0 0.0 - 0.2 %  Comprehensive metabolic panel   Collection Time: 11/19/21 10:19 AM  Result Value Ref Range   Sodium 136 135 - 145 mmol/L   Potassium 3.7 3.5 - 5.1 mmol/L   Chloride 104 98 - 111 mmol/L   CO2 23 22 - 32 mmol/L   Glucose, Bld 84 70 - 99 mg/dL   BUN <5 (L) 6 - 20 mg/dL   Creatinine, Ser 0.66 0.44 - 1.00 mg/dL   Calcium 9.0 8.9 - 10.3 mg/dL   Total Protein 6.5 6.5 - 8.1 g/dL   Albumin 2.8 (L) 3.5 - 5.0 g/dL   AST 22 15 - 41 U/L   ALT 12 0 - 44 U/L   Alkaline Phosphatase 170 (H) 38 - 126 U/L   Total Bilirubin <0.1 (L) 0.3 - 1.2 mg/dL   GFR, Estimated >60 >60 mL/min   Anion gap 9 5 - 15    Patient Active Problem List   Diagnosis Date Noted   UTI due to extended-spectrum beta lactamase (ESBL) producing Escherichia coli 04/30/2021   Encounter for supervision of normal first pregnancy in second trimester 03/27/2021    Assessment: Kellan Boehlke is a 21 y.o. G1P0000 at [redacted]w[redacted]d here for early labor  1. Labor: latent 2. FWB: Cat I 3. Pain: analgesia/anesthesia at request 4. GBS: neg   Plan: Admit to LD Expectant mngt Anticipate SVD  Julianne Handler, CNM  11/19/2021, 6:04 PM

## 2021-11-20 ENCOUNTER — Encounter (HOSPITAL_COMMUNITY): Payer: Self-pay | Admitting: Obstetrics and Gynecology

## 2021-11-20 DIAGNOSIS — O480 Post-term pregnancy: Secondary | ICD-10-CM

## 2021-11-20 DIAGNOSIS — Z3A40 40 weeks gestation of pregnancy: Secondary | ICD-10-CM

## 2021-11-20 LAB — RPR: RPR Ser Ql: NONREACTIVE

## 2021-11-20 MED ORDER — DIPHENHYDRAMINE HCL 25 MG PO CAPS: 25.0000 mg | ORAL_CAPSULE | Freq: Four times a day (QID) | ORAL | Status: DC | PRN

## 2021-11-20 MED ORDER — SENNOSIDES-DOCUSATE SODIUM 8.6-50 MG PO TABS: 2.0000 | ORAL_TABLET | Freq: Every evening | ORAL | Status: DC | PRN

## 2021-11-20 MED ORDER — COCONUT OIL OIL: 1.0000 "application " | TOPICAL_OIL | Status: DC | PRN

## 2021-11-20 MED ORDER — TRANEXAMIC ACID-NACL 1000-0.7 MG/100ML-% IV SOLN: 1000.0000 mg | Freq: Once | INTRAVENOUS | Status: AC

## 2021-11-20 MED ORDER — DIBUCAINE (PERIANAL) 1 % EX OINT
1.0000 | TOPICAL_OINTMENT | CUTANEOUS | Status: DC | PRN
Start: 2021-11-20 — End: 2021-11-22

## 2021-11-20 MED ORDER — BENZOCAINE-MENTHOL 20-0.5 % EX AERO: 1.0000 "application " | INHALATION_SPRAY | CUTANEOUS | Status: DC | PRN

## 2021-11-20 MED ORDER — SODIUM CHLORIDE 0.9 % IV SOLN
3.0000 g | Freq: Four times a day (QID) | INTRAVENOUS | Status: AC
  Administered 2021-11-21 (×4): 3 g via INTRAVENOUS
  Filled 2021-11-20 (×4): qty 8

## 2021-11-20 MED ORDER — METHYLERGONOVINE MALEATE 0.2 MG/ML IJ SOLN
INTRAMUSCULAR | Status: AC
  Administered 2021-11-20: 0.2 mg
  Filled 2021-11-20: qty 1

## 2021-11-20 MED ORDER — SIMETHICONE 80 MG PO CHEW: 80.0000 mg | CHEWABLE_TABLET | ORAL | Status: DC | PRN

## 2021-11-20 MED ORDER — OXYCODONE HCL 5 MG PO TABS: 5.0000 mg | ORAL_TABLET | Freq: Four times a day (QID) | ORAL | Status: DC | PRN

## 2021-11-20 MED ORDER — ONDANSETRON HCL 4 MG PO TABS: 4.0000 mg | ORAL_TABLET | ORAL | Status: DC | PRN

## 2021-11-20 MED ORDER — INFLUENZA VAC SPLIT QUAD 0.5 ML IM SUSY
0.5000 mL | PREFILLED_SYRINGE | INTRAMUSCULAR | Status: AC
  Administered 2021-11-22: 0.5 mL via INTRAMUSCULAR
  Filled 2021-11-20: qty 0.5

## 2021-11-20 MED ORDER — PRENATAL MULTIVITAMIN CH
1.0000 | ORAL_TABLET | Freq: Every day | ORAL | Status: DC
  Administered 2021-11-21 – 2021-11-22 (×2): 1 via ORAL
  Filled 2021-11-20 (×2): qty 1

## 2021-11-20 MED ORDER — FENTANYL CITRATE (PF) 100 MCG/2ML IJ SOLN
100.0000 ug | Freq: Once | INTRAMUSCULAR | Status: DC
  Filled 2021-11-20: qty 2

## 2021-11-20 MED ORDER — FENTANYL CITRATE (PF) 100 MCG/2ML IJ SOLN: INTRAMUSCULAR | Status: DC | PRN

## 2021-11-20 MED ORDER — TETANUS-DIPHTH-ACELL PERTUSSIS 5-2.5-18.5 LF-MCG/0.5 IM SUSY: 0.5000 mL | PREFILLED_SYRINGE | Freq: Once | INTRAMUSCULAR | Status: DC

## 2021-11-20 MED ORDER — METHYLERGONOVINE MALEATE 0.2 MG/ML IJ SOLN: 0.2000 mg | Freq: Once | INTRAMUSCULAR | Status: DC

## 2021-11-20 MED ORDER — TRANEXAMIC ACID-NACL 1000-0.7 MG/100ML-% IV SOLN
INTRAVENOUS | Status: AC
  Administered 2021-11-20: 1000 mg via INTRAVENOUS
  Filled 2021-11-20: qty 100

## 2021-11-20 MED ORDER — ACETAMINOPHEN 325 MG PO TABS
650.0000 mg | ORAL_TABLET | ORAL | Status: DC | PRN
  Administered 2021-11-22 (×2): 650 mg via ORAL
  Filled 2021-11-20 (×2): qty 2

## 2021-11-20 MED ORDER — BUPIVACAINE HCL (PF) 0.5 % IJ SOLN
INTRAMUSCULAR | Status: DC | PRN
  Administered 2021-11-20: 5 mL

## 2021-11-20 MED ORDER — ACETAMINOPHEN 500 MG PO TABS
1000.0000 mg | ORAL_TABLET | Freq: Once | ORAL | Status: AC
  Administered 2021-11-21: 1000 mg via ORAL
  Filled 2021-11-20: qty 2

## 2021-11-20 MED ORDER — OXYTOCIN-SODIUM CHLORIDE 30-0.9 UT/500ML-% IV SOLN: 2.5000 [IU]/h | INTRAVENOUS | Status: DC | PRN

## 2021-11-20 MED ORDER — FENTANYL CITRATE (PF) 100 MCG/2ML IJ SOLN
INTRAMUSCULAR | Status: DC | PRN
Start: 2021-11-20 — End: 2021-11-20
  Administered 2021-11-20: 100 ug via EPIDURAL

## 2021-11-20 MED ORDER — WITCH HAZEL-GLYCERIN EX PADS: 1.0000 "application " | MEDICATED_PAD | CUTANEOUS | Status: DC | PRN

## 2021-11-20 MED ORDER — ONDANSETRON HCL 4 MG/2ML IJ SOLN: 4.0000 mg | INTRAMUSCULAR | Status: DC | PRN

## 2021-11-20 MED ORDER — IBUPROFEN 600 MG PO TABS
600.0000 mg | ORAL_TABLET | Freq: Four times a day (QID) | ORAL | Status: DC
  Administered 2021-11-21 – 2021-11-22 (×7): 600 mg via ORAL
  Filled 2021-11-20 (×6): qty 1

## 2021-11-20 NOTE — Lactation Note (Signed)
This note was copied from a baby's chart. Lactation Consultation Note Assisted baby to breast in cradle position. Baby alert, no cueing noted. Baby would take nipple and hold it in her mouth, then occasionally suckle. Mom doesn't like it at all. Explained the baby doesn't know yet what she is doing and has an uncoordinated suck and is clamping and biting at times. Mom stated even when she sucks it hurts to bad. Mom stated she is going to have to have formula. Mom stated she could pump and bottle feed when her milk comes in. [facility] agreed. Explained most of the time the colostrum comes in small amounts so doesn't get discouraged. Mom stated she will give formula until her milk comes. Praised mom. Will f/u on MBU and set up pump for mom. Informed RN mom wants formula.  Patient Name: Carla Stevens MEQAS'T Date: 11/20/2021 Reason for consult: L&D Initial assessment;Primapara;Term Age:81 hours  Maternal Data    Feeding Mother's Current Feeding Choice: Breast Milk and Formula  LATCH Score Latch: Repeated attempts needed to sustain latch, nipple held in mouth throughout feeding, stimulation needed to elicit sucking reflex.  Audible Swallowing: None  Type of Nipple: Flat (very compressible)  Comfort (Breast/Nipple): Soft / non-tender  Hold (Positioning): Full assist, staff holds infant at breast  LATCH Score: 4   Lactation Tools Discussed/Used    Interventions Interventions: Assisted with latch;Skin to skin;Adjust position;Support pillows  Discharge    Consult Status Consult Status: Follow-up from L&D Date: 11/21/21 Follow-up type: In-patient    Charyl Dancer 11/20/2021, 9:55 PM

## 2021-11-20 NOTE — Progress Notes (Signed)
L&D Note  11/20/2021 - 6:13 PM  21 y.o. G1 [redacted]w[redacted]d. Pregnancy complicated by nothing  Patient Active Problem List   Diagnosis Date Noted   Post-dates pregnancy 11/19/2021   UTI due to extended-spectrum beta lactamase (ESBL) producing Escherichia coli 04/30/2021   Encounter for supervision of normal first pregnancy in second trimester 03/27/2021    Ms. Carla Stevens is admitted for early labor   Subjective:  More comfortable with epidural adjustment  Objective:     Current Vital Signs 24h Vital Sign Ranges  T 99.1 F (37.3 C) Temp  Avg: 99.1 F (37.3 C)  Min: 97.9 F (36.6 C)  Max: 101.6 F (38.7 C)  BP 130/86 BP  Min: 100/69  Max: 147/74  HR 77 Pulse  Avg: 77.4  Min: 64  Max: 103  RR 16 Resp  Avg: 17.5  Min: 16  Max: 20  SaO2 100 % Room Air SpO2  Avg: 99.9 %  Min: 99 %  Max: 100 %       24 Hour I/O Current Shift I/O  Time Ins Outs 02/27 0701 - 02/28 0700 In: -  Out: 1550 [Urine:1550] 02/28 0701 - 02/28 1900 In: -  Out: 675 [Urine:675]   FHR: category I with early decels Toco: q2-79m Gen: feels UCs but comfortable SVE: DOA, +2 to +3, efw 3400gm  Labs:  Recent Labs  Lab 11/19/21 1019  WBC 10.3  HGB 11.4*  HCT 35.8*  PLT 234   Recent Labs  Lab 11/19/21 1019  NA 136  K 3.7  CL 104  CO2 23  BUN <5*  CREATININE 0.66  CALCIUM 9.0  PROT 6.5  BILITOT <0.1*  ALKPHOS 170*  ALT 12  AST 22  GLUCOSE 84    Medications Current Facility-Administered Medications  Medication Dose Route Frequency Provider Last Rate Last Admin   acetaminophen (TYLENOL) tablet 650 mg  650 mg Oral Q4H PRN [place] Bing, MD   650 mg at 11/20/21 1518   diphenhydrAMINE (BENADRYL) injection 12.5 mg  12.5 mg Intravenous Q15 min PRN Kaylyn Layer, MD       ePHEDrine injection 10 mg  10 mg Intravenous PRN Kaylyn Layer, MD       ePHEDrine injection 10 mg  10 mg Intravenous PRN Kaylyn Layer, MD       fentaNYL (SUBLIMAZE) injection 100 mcg  100 mcg Intravenous Q1H PRN Donette Larry, CNM   100 mcg at 11/19/21 1853   fentaNYL (SUBLIMAZE) injection 100 mcg  100 mcg Intravenous Once Trevor Iha, MD       fentaNYL 2 mcg/mL w/ bupivacaine 0.125% in NS 250 mL epidural infusion  12 mL/hr Epidural Continuous PRN Kaylyn Layer, MD 12 mL/hr at 11/20/21 1010 12 mL/hr at 11/20/21 1010   lactated ringers infusion 500-1,000 mL  500-1,000 mL Intravenous PRN [place] Bing, MD 999 mL/hr at 11/20/21 1522 500 mL at 11/20/21 1522   lactated ringers infusion   Intravenous Continuous [place] Bing, MD 125 mL/hr at 11/20/21 1521 New Bag at 11/20/21 1521   lidocaine (PF) (XYLOCAINE) 1 % injection 30 mL  30 mL Subcutaneous PRN [place] Bing, MD       misoprostol (CYTOTEC) tablet 25 mcg  25 mcg Vaginal Q4H PRN [place] Bing, MD       ondansetron (ZOFRAN) injection 4 mg  4 mg Intravenous Q6H PRN  Bing, MD   4 mg at 11/20/21 0600   oxytocin (PITOCIN) IV BOLUS FROM BAG  333 mL Intravenous  Once [place] Bing, MD       oxytocin (PITOCIN) IV infusion 30 units in NS 500 mL - Premix  2.5 Units/hr Intravenous Continuous [place] Bing, MD       oxytocin (PITOCIN) IV infusion 30 units in NS 500 mL - Premix  1-40 milli-units/min Intravenous Titrated Leticia Penna N, DO 4 mL/hr at 11/20/21 1628 4 milli-units/min at 11/20/21 1628   PHENYLephrine 40 mcg/ml in normal saline Adult IV Push Syringe (For Blood Pressure Support)  80 mcg Intravenous PRN Kaylyn Layer, MD       PHENYLephrine 40 mcg/ml in normal saline Adult IV Push Syringe (For Blood Pressure Support)  80 mcg Intravenous PRN Kaylyn Layer, MD       sodium citrate-citric acid (ORACIT) solution 30 mL  30 mL Oral Q2H PRN [place] Bing, MD       terbutaline (BRETHINE) injection 0.25 mg  0.25 mg Subcutaneous Once PRN [place] Bing, MD       terbutaline (BRETHINE) injection 0.25 mg  0.25 mg Subcutaneous Once PRN Beard, Samantha N, DO       Facility-Administered Medications Ordered in Other Encounters   Medication Dose Route Frequency Provider Last Rate Last Admin   bupivacaine(PF) (MARCAINE) 0.5 % injection   Other Anesthesia Intra-op Trevor Iha, MD   5 mL at 11/20/21 1647   fentaNYL (SUBLIMAZE) injection   Epidural Anesthesia Intra-op Trevor Iha, MD   100 mcg at 11/20/21 1647   lidocaine (PF) (XYLOCAINE) 1 % injection   Epidural Anesthesia Intra-op Kaylyn Layer, MD   4 mL at 11/19/21 2024    Assessment & Plan:  Pt doing well *Pregnancy: pt pushing well. Anticipate SVD. GBS neg  Cornelia Copa MD Attending Center for [facility])

## 2021-11-20 NOTE — Progress Notes (Signed)
Called by RN that patient's temp is now 103.15F with otherwise stable vitals (mild tachycardia 103 bpm).   She had a fever in labor (100.748F and 101.748F) but attributed to alternative factors at the time without any associated fetal or maternal tachycardia.  Suspect continuation of chorioamnionitis/pp endometritis. Ordered tylenol 1000mg  and IV Unasyn q 24 hours.  Monitor closely.   , DO

## 2021-11-20 NOTE — Progress Notes (Addendum)
Carla Stevens is a 21 y.o. G1P0000 at [redacted]w[redacted]d by LMP admitted for SOL.  She had one elevated BP in MAU-elevated DBP in setting of pain.   Subjective: Pt reports she is doing well, no pain, and ready for AROM. No other questions or concerns.  Objective: Pt sitting high fowlers, and NAD. BP 119/87    Pulse 79    Temp 98.4 F (36.9 C) (Axillary)    Resp 16    LMP 02/11/2021    SpO2 100%  I/O last 3 completed shifts: In: -  Out: 1550 [Urine:1550] Total I/O In: -  Out: 400 [Urine:400]  FHT:  FHR: 120's bpm, variability: minimal ,  accelerations:  Present,  decelerations:  Absent UC:   External: 1-3, 60-120, moderate SVE:   Dilation: 4 Effacement (%): 80, 90 Station: -2 Exam by:: [employer], SNM  Labs: Lab Results  Component Value Date   WBC 10.3 11/19/2021   HGB 11.4 (L) 11/19/2021   HCT 35.8 (L) 11/19/2021   MCV 82.9 11/19/2021   PLT 234 11/19/2021    Assessment / Plan: SOL (Latent labor):Discussed AROM. AROM: large amount of clear fluid. Continue pitocin and titrate up as needed with good fetal wellbeing. Fetal Wellbeing:  Category I Pain Control:  Epidural I/D:   GBS negative Anticipated MOD:  NSVD  Sotiria Keast 11/20/2021, 9:33 AM

## 2021-11-20 NOTE — Progress Notes (Addendum)
Carla Stevens is a 21 y.o. G1P0000 at [redacted]w[redacted]d by LMP admitted for SOL.    Subjective: Pt doing well, no questions and no concerns.  At 1530, pt reported intermittent lower abdomen pain, denied urge to push. She has tried pushing epidural with minimal relief. Would like to start pushing.   Objective: Pt sitting high flowers. FOB and maternal grandmother remains at bedside.   BP (!) 147/74    Pulse 70    Temp (!) 101.6 F (38.7 C) (Axillary)    Resp 16    LMP 02/11/2021    SpO2 100%  I/O last 3 completed shifts: In: -  Out: 1550 [Urine:1550] Total I/O In: -  Out: 600 [Urine:600]  FHT:  FHR: 140's bpm, variability: moderate,  accelerations:  Present,  decelerations:  Absent UC:   External: 1-3 minutes, last 2-3 minutes, moderate SVE:   Dilation: 10 Effacement (%): 100 Station: -1, 0 Exam by:: Olga Coaster, SNM  Labs: Lab Results  Component Value Date   WBC 10.3 11/19/2021   HGB 11.4 (L) 11/19/2021   HCT 35.8 (L) 11/19/2021   MCV 82.9 11/19/2021   PLT 234 11/19/2021    Assessment / Plan: SOL: labored down x 2 hours, pushing resumed at 1530, fetal station +1, baby feels LOA. Pitocin restarted at 1530 to help increase frequency of contractions. Now pushing x 1 hours, baby feels OA, minimal descent. 1628-Pt requesting to labor down and  more pain meds. Anesthesia called for consult.   -Maternal temp: 101.6-axillary, removed blankets, tylenol per protocol, IV fluid bolus. Repeat temp in 1 hour.  Fetal Wellbeing:  Category I Pain Control:  Epidural I/D:   GBS negative Anticipated MOD:  NSVD  Wally Behan 11/20/2021, 4:21 PM

## 2021-11-20 NOTE — Progress Notes (Signed)
Carla Stevens is a 21 y.o. G1P0000 at [redacted]w[redacted]d by LMP admitted for SOL.    Subjective: Pt reports increased left sided pain and lower abdominal pressure.  Denies urge to push, "I am hurting".   Objective: Pt laying semi-fowlers, right lateral. FOB at bedside.   BP 120/81    Pulse 73    Temp 98.4 F (36.9 C) (Axillary)    Resp 16    LMP 02/11/2021    SpO2 99%  I/O last 3 completed shifts: In: -  Out: 1550 [Urine:1550] Total I/O In: -  Out: 400 [Urine:400]  FHT:  FHR: 110-120's bpm, variability: moderate,  accelerations:  Present,  decelerations:  Absent UC:   External: 1-2, 60-140, moderate SVE:   Dilation: 10 Effacement (%): 100 Station: -1, 0 Exam by:: [employer], SNM  Labs: Lab Results  Component Value Date   WBC 10.3 11/19/2021   HGB 11.4 (L) 11/19/2021   HCT 35.8 (L) 11/19/2021   MCV 82.9 11/19/2021   PLT 234 11/19/2021    Assessment / Plan: SOL (Active labor):Pt progressed rapidly to complete from 8 cm in 7 minutes. Pitocin  d/c'd. Station 0 with molding, will labor down. Fetal Wellbeing:  Category II, variable and early decel's.  Rapid cervical dilation, variable decels resolved with position changed.  Pain Control:  Epidural I/D:   GBS negative Anticipated MOD:  NSVD  Toivo Bordon 11/20/2021, 12:45 PM

## 2021-11-20 NOTE — Discharge Summary (Addendum)
?  Postpartum Discharge Summary ? ? ?   ?Patient Name: Carla Stevens ?DOB: 2000-10-24 ?MRN: [id number] ? ?Date of admission: 11/19/2021 ?Delivery date:11/20/2021  ?Delivering provider: Aletha Halim  ?Date of discharge: 11/22/2021 ? ?Admitting diagnosis: Pregnancy at 40/1. Early labor ?Additional problems: History of UTI with negative TOC    ?Discharge diagnosis: Term Pregnancy Delivered, acute blood loss anemia, Anxiety                                           ?Post partum procedures: Depo administered prior to discharge ?Augmentation: AROM and Pitocin ?Complications: prolonged 2nd stage. PPH (EBL 841mL) due to atony, laceration, Triple I ? ?Hospital course: Onset of Labor With Vaginal Delivery      ?21 y.o. yo G1 admitted for 3cm, early labor. She underwent pitocin augmentation and AROM. She had a prolonged 2nd stage due to discomfort with pushing, desired to wait to push. She had a low VAVD w/o issue (see delivery note) with an EBL of 836mL despite prophylactic TXA and pitocin due to atony and midline vaginal laceration. Methergine IM x 1 was also given due to increased bleeding.  ?Membrane Rupture Time/Date: 9:26 AM ,11/20/2021   ?Delivery Method:Vaginal, Vacuum (Extractor)  ?Episiotomy: None  ?Lacerations:  2nd degree;Labial  ?Patient had an uncomplicated postpartum course. Noted to have a fever of 103.14F PPD#0, administered IV Unasyn q24hr. No further fevers after initiation of antibiotics. She did have a HgB drop from 11.4>9 and therefore was given IV Venofer. On PPD#2, she is afebrile, ambulating, tolerating a regular diet, passing flatus, and urinating well. Patient is discharged home in stable condition on 11/22/21. ? ? ?Newborn Data: ?Birth date:11/20/2021  ?Birth time:8:36 PM  ?Gender:Female  ?Living status:Living  ?Apgars:7 ,9  ?IWOEHO:1224 g  ? ?Magnesium Sulfate received: No ?BMZ received: No ?Rhophylac:N/A ?MMR:N/A ?T-DaP:Given prenatally ?Transfusion:Yes - IV iron ? ?Physical exam  ?Vitals:  ? 11/21/21  0934 11/21/21 1356 11/21/21 2114 11/22/21 0534  ?BP: 105/63 119/80 111/73 119/76  ?Pulse: 87 78 90 70  ?Resp:   17 18  ?Temp:  97.9 ?F (36.6 ?C) 97.8 ?F (36.6 ?C) 97.8 ?F (36.6 ?C)  ?TempSrc:  Oral Oral Oral  ?SpO2:   100% 98%  ? ?General: alert, cooperative, and no distress ?Lochia: appropriate ?Uterine Fundus: firm and below umbilicus ?DVT Evaluation: no LE edema, no calf tenderness to palpation ? ?Labs: ?CBC Latest Ref Rng & Units 11/21/2021 11/19/2021 09/06/2021  ?WBC 4.0 - 10.5 K/uL 22.6(H) 10.3 12.9(H)  ?Hemoglobin 12.0 - 15.0 g/dL 9.0(L) 11.4(L) 11.7  ?Hematocrit 36.0 - 46.0 % 26.6(L) 35.8(L) 34.6  ?Platelets 150 - 400 K/uL 167 234 309  ? ?CMP Latest Ref Rng & Units 11/19/2021  ?Glucose 70 - 99 mg/dL 84  ?BUN 6 - 20 mg/dL <5(L)  ?Creatinine 0.44 - 1.00 mg/dL 0.66  ?Sodium 135 - 145 mmol/L 136  ?Potassium 3.5 - 5.1 mmol/L 3.7  ?Chloride 98 - 111 mmol/L 104  ?CO2 22 - 32 mmol/L 23  ?Calcium 8.9 - 10.3 mg/dL 9.0  ?Total Protein 6.5 - 8.1 g/dL 6.5  ?Total Bilirubin 0.3 - 1.2 mg/dL <0.1(L)  ?Alkaline Phos 38 - 126 U/L 170(H)  ?AST 15 - 41 U/L 22  ?ALT 0 - 44 U/L 12  ? ?Edinburgh Score: ?Edinburgh Postnatal Depression Scale Screening Tool 11/21/2021  ?I have been able to laugh and see the funny side of things. 0  ?  I have looked forward with enjoyment to things. 0  ?I have blamed myself unnecessarily when things went wrong. 0  ?I have been anxious or worried for no good reason. 0  ?I have felt scared or panicky for no good reason. 0  ?Things have been getting on top of me. 0  ?I have been so unhappy that I have had difficulty sleeping. 0  ?I have felt sad or miserable. 0  ?I have been so unhappy that I have been crying. 0  ?The thought of harming myself has occurred to me. 0  ?Edinburgh Postnatal Depression Scale Total 0  ? ? ? ? ?After visit meds:  ?Allergies as of 11/22/2021   ? ?   Reactions  ? Watermelon [citrullus Vulgaris] Hives  ? ?  ? ?  ?Medication List  ?  ? ?STOP taking these medications   ? ?Gojji Weight Scale  Misc ?  ? ?  ? ?TAKE these medications   ? ?acetaminophen 325 MG tablet ?Commonly known as: Tylenol ?Take 2 tablets (650 mg total) by mouth every 4 (four) hours as needed (for pain scale < 4). ?  ?Blood Pressure Kit Devi ?1 Device by Does not apply route as needed. ?  ?ibuprofen 600 MG tablet ?Commonly known as: ADVIL ?Take 1 tablet (600 mg total) by mouth every 6 (six) hours. ?  ?multivitamin-prenatal 27-0.8 MG Tabs tablet ?Take 1 tablet by mouth daily at 12 noon. ?  ? ?  ? ? ? ?Discharge home in stable condition ?Infant Feeding: Bottle and Breast ?Infant Disposition:home with mother ?Discharge instruction: per After Visit Summary and Postpartum booklet. ?Activity: Advance as tolerated. Pelvic rest for 6 weeks.  ?Diet: routine diet ?Anticipated Birth Control:  Depo ?Postpartum Appointment:1 week PP depression check and 4-6 wk PP visit ?Additional Postpartum F/U: Postpartum Depression checkup ?Future Appointments:No future appointments. ?Follow up Visit: ? ?Message sent to [facility] by Dr. Cy Blamer on 3/2 ?  ? ?Karsten Fells, DO PGY-1 ?11/22/2021, 8:06 AM ? ? ?GME ATTESTATION:  ?I saw and evaluated the patient. I agree with the findings and the plan of care as documented in the resident?s note and made all necessary edits. ? ?Renard Matter, MD, MPH ?OB Fellow, Faculty Practice ?[place] [facility] ?11/22/2021 8:47 AM ? ?

## 2021-11-20 NOTE — Progress Notes (Signed)
Labor Progress Note Carla Stevens is a 21 y.o. G1P0000 at [redacted]w[redacted]d presented for early labor.   S: Comfortable with epidural. Feeling occasional pressure.   O:  BP 110/81    Pulse 66    Temp 98.2 F (36.8 C) (Oral)    Resp 18    LMP 02/11/2021    SpO2 100%  EFM: 125/mod/15x15  CVE: Dilation: 3.5 Effacement (%): 90 Station: -2 Presentation: Vertex Exam by:: Dr. Annia Friendly   A&P: 21 y.o. G1P0000 [redacted]w[redacted]d  #Labor: Largely unchanged since last check, with irregular contraction pattern at times. Offered AROM vs continued pit titration, patient would like to wait a little longer to see if her cervix will dilate more and have possible SROM. She is willing for AROM by next check if not changed. Working towards frequent position changes. Titrate pit as tolerated.  #Pain: Epidural  #FWB: Cat I  #GBS negative  Allayne Stack, DO 2:25 AM

## 2021-11-20 NOTE — Progress Notes (Signed)
Carla Stevens is a 21 y.o. G1P0000 at [redacted]w[redacted]d by LMP admitted for SOL.    Subjective: Pt doing well, no questions and no concerns.  Denies urge to push.   Objective: Pt sitting high flowers. FOB and maternal grandmother at bedside.   BP 120/81    Pulse 73    Temp (!) 100.7 F (38.2 C) (Axillary)    Resp 16    LMP 02/11/2021    SpO2 99%  I/O last 3 completed shifts: In: -  Out: 1550 [Urine:1550] Total I/O In: -  Out: 400 [Urine:400]  FHT:  FHR: 140's bpm, variability: moderate,  accelerations:  Present,  decelerations:  Absent UC:   External: 3, 90, moderate SVE:   Dilation: 10 Effacement (%): 100 Station: -1, 0 Exam by:: Olga Coaster, SNM  Labs: Lab Results  Component Value Date   WBC 10.3 11/19/2021   HGB 11.4 (L) 11/19/2021   HCT 35.8 (L) 11/19/2021   MCV 82.9 11/19/2021   PLT 234 11/19/2021    Assessment / Plan: SOL: labored down x 1 hour, pushing with minimal descent, baby feels OP: will try throne position with alternating leg elevation on peanut ball.  Maternal temp: 100.7-axillary, pt wrapped in several blankets, will remove all covers except 1 and recheck oral temp in 30 minutes, Fetal Wellbeing:  Category I Pain Control:  Epidural I/D:   GBS negative Anticipated MOD:  NSVD  Mahamadou Weltz 11/20/2021, 2:00 PM

## 2021-11-20 NOTE — Lactation Note (Signed)
This note was copied from a baby's chart. Lactation Consultation Note Attempted to see mom. Still working on mom.  Patient Name: Carla Stevens [id number]'K Date: 11/20/2021   Age:21 hours  Maternal Data    Feeding    LATCH Score                    Lactation Tools Discussed/Used    Interventions    Discharge    Consult Status      Charyl Dancer 11/20/2021, 9:10 PM

## 2021-11-21 ENCOUNTER — Encounter: Payer: Medicaid Other | Admitting: Student

## 2021-11-21 LAB — CBC
HCT: 26.6 % — ABNORMAL LOW (ref 36.0–46.0)
Hemoglobin: 9 g/dL — ABNORMAL LOW (ref 12.0–15.0)
MCH: 27.4 pg (ref 26.0–34.0)
MCHC: 33.8 g/dL (ref 30.0–36.0)
MCV: 81.1 fL (ref 80.0–100.0)
Platelets: 167 10*3/uL (ref 150–400)
RBC: 3.28 MIL/uL — ABNORMAL LOW (ref 3.87–5.11)
RDW: 16.2 % — ABNORMAL HIGH (ref 11.5–15.5)
WBC: 22.6 10*3/uL — ABNORMAL HIGH (ref 4.0–10.5)
nRBC: 0 % (ref 0.0–0.2)

## 2021-11-21 MED ORDER — SODIUM CHLORIDE 0.9 % IV SOLN
500.0000 mg | Freq: Once | INTRAVENOUS | Status: AC
  Administered 2021-11-21: 500 mg via INTRAVENOUS
  Filled 2021-11-21: qty 25

## 2021-11-21 NOTE — Lactation Note (Signed)
This note was copied from a baby's chart. ?Lactation Consultation Note ?LC went to see mom d/t RN stated she was awake. ?LC to set up DEBP. Mom stated she wasn't going to pump she is just going to do formula. Plans has changed said mom. ?Lactation brochure given. Encouraged mom to call if she changes her mind. ? ?Patient Name: Carla Stevens ?Today's Date: 11/21/2021 ?Reason for consult: Initial assessment;Primapara;Term ?Age:21 hours ? ?Maternal Data ?  ? ?Feeding ?Mother's Current Feeding Choice: Formula ?Nipple Type: Slow - flow ? ?LATCH Score ?  ? ?  ? ?  ? ?  ? ?  ? ?  ? ? ?Lactation Tools Discussed/Used ?  ? ?Interventions ?  ? ?Discharge ?  ? ?Consult Status ?Consult Status: Complete ?Date: 11/21/21 ? ? ? ?Charyl Dancer ?11/21/2021, 1:48 AM ? ? ? ?

## 2021-11-21 NOTE — Social Work (Signed)
CSW received consult for hx of Anxiety.  CSW met with MOB to offer support and complete assessment.    ? ?CSW introduced self and role. CSW met with MOB to assess and provide support. CSW introduced self and role. CSW observed FOB 'Kristian' holding newborn with MOB on couch. MOB declined to have FOB leave the room for assessment. CSW informed MOB reason for consult and assessed current mood.Marland Kitchen MOB was pleasant and engaged as she expressed that she is feeling good. MOB shared the pregnancy was also smooth. CSW inquired on MOB mental health history. MOB stated she have never been diagnosed with anxiety or any there mental health condition. MOB reported she experienced general anxiety during the pregnancy due to being worried about 'Alya,' however it was not unmanageable. MOB stated she has never been prescribed medication or been to therapy. MOB identified FOB, her sister mother and grandmother as primary supports postpartum. MOB denies any current SI or HI.  ? ?CSW provided education regarding the baby blues period versus PPD and provided resources. CSW provided the New Mom Checklist and encouraged MOB to self evaluate. ?CSW provided review of Sudden Infant Death Syndrome (SIDS) precautions. MOB reported infant will sleep in a bassinet. MOB stated she has everything needed for infant, including a car seat. MOB stated she knows how to contact [facility] if needed. MOB reported she has no additional stressors at this time and declined any additional referrals.  ? ?CSW identifies no further need for intervention and no barriers to discharge at this time. ? ?Darra Lis, LCSWA ?Clinical Social Work ?[facility] [place] ?[phone] ?

## 2021-11-21 NOTE — Anesthesia Postprocedure Evaluation (Signed)
Anesthesia Post Note ? ?Patient: Carla Stevens ? ?Procedure(s) Performed: AN AD HOC LABOR EPIDURAL ? ?  ? ?Patient location during evaluation: Mother Baby ?Anesthesia Type: Epidural ?Level of consciousness: awake and alert ?Pain management: pain level controlled ?Vital Signs Assessment: post-procedure vital signs reviewed and stable ?Respiratory status: spontaneous breathing, nonlabored ventilation and respiratory function stable ?Cardiovascular status: stable ?Postop Assessment: no headache, no backache and epidural receding ?Anesthetic complications: no ? ? ?No notable events documented. ? ?Last Vitals:  ?Vitals:  ? 11/21/21 0050 11/21/21 0359  ?BP:  115/72  ?Pulse:  98  ?Resp:  18  ?Temp: 36.9 ?C 36.6 ?C  ?SpO2:  100%  ?  ?Last Pain:  ?Vitals:  ? 11/21/21 0730  ?TempSrc:   ?PainSc: 0-No pain  ? ?Pain Goal:   ? ?  ?  ?  ?  ?  ?  ?Epidural/Spinal Function Cutaneous sensation: Normal sensation (11/21/21 0730), Patient able to flex knees: Yes (11/21/21 0730), Patient able to lift hips off bed: Yes (11/21/21 0730), Back pain beyond tenderness at insertion site: No (11/21/21 0730), Progressively worsening motor and/or sensory loss: No (11/21/21 0730), Bowel and/or bladder incontinence post epidural: No (11/21/21 0730) ? ?Minami Arriaga N ? ? ? ? ?

## 2021-11-21 NOTE — Progress Notes (Signed)
Post Partum Day 1 ? ?Subjective: ?Doing well. No acute events overnight. Pain is controlled and bleeding is appropriate. She is eating, drinking, and ambulating without issue. Her foley catheter was just removed by RN without complication. Her labial swelling has improved. She is formula feeding which is going well. She denies lightheadedness or dizziness. She has no other concerns at this time. ? ?Objective: ?Blood pressure 105/63, pulse 87, temperature 97.9 ?F (36.6 ?C), temperature source Oral, resp. rate 18, last menstrual period 02/11/2021, SpO2 100 %, unknown if currently breastfeeding. ? ?Physical Exam:  ?General: alert, cooperative, and no distress ?Lochia: appropriate ?Uterine Fundus: firm and below umbilicus  ?DVT Evaluation: no LE edema or calf tenderness to palpation  ? ?Recent Labs  ?  11/19/21 ?1019 11/21/21 ?0518  ?HGB 11.4* 9.0*  ?HCT 35.8* 26.6*  ? ? ?Assessment/Plan: ?Carla Stevens is a 21 y.o. G1P1001 on PPD# 1 s/p VAVD. ? ?Progressing well. Meeting postpartum milestones. VSS. Continue routine postpartum care. ? ?#Triple I: ?- Fever curve appropriate since starting antibiotics post-delivery ?- Plan for Unasyn for 24 hours ?- Will continue to monitor  ? ?#Acute blood loss anemia: ?- Hgb 11.4 > 9.0 this AM ?- Asymptomatic ?- IV Venofer ordered to be given today  ? ?Feeding: Formula  ?Contraception: Depo prior to discharge  ? ?Dispo: Plan for discharge on PPD#2.  ? ? LOS: 2 days  ? ?Genia Del, MD  ?11/21/2021, 9:36 AM  ? ? ?

## 2021-11-22 MED ORDER — IBUPROFEN 600 MG PO TABS: 600.0000 mg | ORAL_TABLET | Freq: Four times a day (QID) | ORAL | 0 refills | Status: DC

## 2021-11-22 MED ORDER — MEDROXYPROGESTERONE ACETATE 150 MG/ML IM SUSP
150.0000 mg | Freq: Once | INTRAMUSCULAR | Status: AC
  Administered 2021-11-22: 150 mg via INTRAMUSCULAR
  Filled 2021-11-22: qty 1

## 2021-11-22 MED ORDER — ACETAMINOPHEN 325 MG PO TABS: 650.0000 mg | ORAL_TABLET | ORAL | 0 refills | Status: DC | PRN

## 2021-11-22 NOTE — BH Specialist Note (Unsigned)
Integrated Behavioral Health via Telemedicine Visit  11/22/2021 Carla Stevens [id number]  Number of Integrated Behavioral Health Clinician visits: No data recorded Session Start time: No data recorded  Session End time: No data recorded Total time in minutes: No data recorded  Referring Provider: *** Patient/Family location: *** [facility] Provider location: *** All persons participating in visit: *** Types of Service: {CHL AMB TYPE OF SERVICE:[phone]}  I connected with Carla Stevens and/or Carla Stevens's {family members:20773} via  Telephone or Video Enabled Telemedicine Application  (Video is Caregility application) and verified that I am speaking with the correct person using two identifiers. Discussed confidentiality: {YES/NO:21197}  I discussed the limitations of telemedicine and the availability of in person appointments.  Discussed there is a possibility of technology failure and discussed alternative modes of communication if that failure occurs.  I discussed that engaging in this telemedicine visit, they consent to the provision of behavioral healthcare and the services will be billed under their insurance.  Patient and/or legal guardian expressed understanding and consented to Telemedicine visit: {YES/NO:21197}  Presenting Concerns: Patient and/or family reports the following symptoms/concerns: *** Duration of problem: ***; Severity of problem: {Mild/Moderate/Severe:20260}  Patient and/or Family's Strengths/Protective Factors: {CHL AMB BH PROTECTIVE FACTORS:[phone]}  Goals Addressed: Patient will:  Reduce symptoms of: {IBH Symptoms:21014056}   Increase knowledge and/or ability of: {IBH Patient Tools:21014057}   Demonstrate ability to: {IBH Goals:21014053}  Progress towards Goals: {CHL AMB BH PROGRESS TOWARDS GOALS:[phone]}  Interventions: Interventions utilized:  {IBH Interventions:21014054} Standardized Assessments completed: {IBH Screening  Tools:21014051}  Patient and/or Family Response: ***  Assessment: Patient currently experiencing ***.   Patient may benefit from ***.  Plan: Follow up with behavioral health clinician on : *** Behavioral recommendations: *** Referral(s): {IBH Referrals:21014055}  I discussed the assessment and treatment plan with the patient and/or parent/guardian. They were provided an opportunity to ask questions and all were answered. They agreed with the plan and demonstrated an understanding of the instructions.   They were advised to call back or seek an in-person evaluation if the symptoms worsen or if the condition fails to improve as anticipated.  Valetta Close Makenzye Troutman, LCSW

## 2021-11-24 ENCOUNTER — Inpatient Hospital Stay (HOSPITAL_COMMUNITY)
Admission: AD | Admit: 2021-11-24 | Payer: Medicaid Other | Source: Home / Self Care | Admitting: Obstetrics and Gynecology

## 2021-11-24 ENCOUNTER — Inpatient Hospital Stay (HOSPITAL_COMMUNITY): Payer: Medicaid Other

## 2021-11-30 ENCOUNTER — Telehealth (HOSPITAL_COMMUNITY): Payer: Self-pay | Admitting: *Deleted

## 2021-11-30 NOTE — Telephone Encounter (Signed)
Mom reports feeling good. No concerns about herself at this time. EPDS=0 [facility] score=0) ?Mom reports baby is doing well. Feeding, peeing, and pooping without difficulty. Safe sleep reviewed. Mom reports no concerns about baby at present. ? ?Duffy Rhody, RN 11-30-2021 at 1:43pm ?

## 2021-12-04 ENCOUNTER — Ambulatory Visit (INDEPENDENT_AMBULATORY_CARE_PROVIDER_SITE_OTHER): Payer: Medicaid Other | Admitting: Clinical

## 2021-12-04 DIAGNOSIS — Z7189 Other specified counseling: Secondary | ICD-10-CM

## 2021-12-04 NOTE — Patient Instructions (Signed)
[facility] [employer] [facility] ?[address] ?[place], [place] [address] ?[phone] (main office) ?[phone] (Rondle Lohse's office) ? ?New Parent Support Groups ?[email or web address] ?[email or web address]  ? ?[place] [employer]  ?(Childcare options, Early childcare development, etc.) ?[email or web address] ? ?

## 2021-12-25 ENCOUNTER — Ambulatory Visit (INDEPENDENT_AMBULATORY_CARE_PROVIDER_SITE_OTHER): Payer: Medicaid Other | Admitting: Family Medicine

## 2021-12-25 ENCOUNTER — Encounter: Payer: Self-pay | Admitting: Family Medicine

## 2021-12-25 NOTE — Progress Notes (Signed)
? ? ?Post Partum Visit Note ? ?Carla Stevens is a 21 y.o. G84P1001 female who presents for a postpartum visit. She is 5 weeks postpartum following a vacuum-assisted vaginal delivery.  I have fully reviewed the prenatal and intrapartum course. The delivery was at 40.2 gestational weeks.  Anesthesia: epidural. Postpartum course has been good. Baby is doing well. Baby is feeding by bottle - Similac Sensitive RS. Bleeding no bleeding. Bowel function is normal. Bladder function is normal. Patient is sexually active. Contraception method is Depo-Provera injections. Postpartum depression screening: negative. ? ?She is due for a pap smear today but wants to return for this instead.  ? ? ?The pregnancy intention screening data noted above was reviewed. Potential methods of contraception were discussed. The patient elected to proceed with No data recorded. ? ? Edinburgh Postnatal Depression Scale - 12/25/21 1429   ? ?  ? Edinburgh Postnatal Depression Scale:  In the Past 7 Days  ? I have been able to laugh and see the funny side of things. 0   ? I have looked forward with enjoyment to things. 0   ? I have blamed myself unnecessarily when things went wrong. 0   ? I have been anxious or worried for no good reason. 0   ? I have felt scared or panicky for no good reason. 0   ? Things have been getting on top of me. 0   ? I have been so unhappy that I have had difficulty sleeping. 0   ? I have felt sad or miserable. 0   ? I have been so unhappy that I have been crying. 0   ? The thought of harming myself has occurred to me. 0   ? Edinburgh Postnatal Depression Scale Total 0   ? ?  ?  ? ?  ? ? ?Health Maintenance Due  ?Topic Date Due  ? HPV VACCINES (1 - 2-dose series) Never done  ? PAP-Cervical Cytology Screening  12/01/2021  ? PAP SMEAR-Modifier  12/01/2021  ? ? ? ?Review of Systems ?Pertinent items are noted in HPI. ? ?Objective:  ?BP 119/75   Pulse 77   Wt 134 lb (60.8 kg)   LMP 02/11/2021   BMI 23.00 kg/m?   ? ?General:   alert, cooperative, and no distress  ? Breasts:  not indicated  ?Lungs: Normal WOB   ?Heart:  Regular rate  ?Abdomen: Soft, non-tender    ?Wound NA  ?GU exam:  Deferred per patient  ?     ?Assessment:  ? ? 1. Postpartum care and examination ? ?Plan:  ? ?Essential components of care per ACOG recommendations: ? ?1.  Mood and well being: Patient with negative depression screening today. Reviewed local resources for support.  ?- Patient tobacco use? No.   ?- hx of drug use? No.   ? ?2. Infant care and feeding:  ?-Patient currently breastmilk feeding? No.  ? ?3. Sexuality, contraception and birth spacing ?- Patient does not want a pregnancy in the next year.   ?- Reviewed reproductive life planning. Reviewed contraceptive methods based on pt preferences and effectiveness.  Patient desired depo (got postpartum already), aware of every 3 month interval if she continues this. ?- Discussed birth spacing of 18 months ? ?4. Sleep and fatigue ?-Encouraged family/partner/community support of 4 hrs of uninterrupted sleep to help with mood and fatigue ? ?5. Physical Recovery  ?- Discussed patients delivery and complications. She describes her labor as good. ?- Patient had a  vacuum  assisted vaginal delivery . Patient had a 2nd degree laceration. Perineal healing reviewed. Patient expressed understanding ?- Patient has urinary incontinence? No. ?- Patient is safe to resume physical and sexual activity ? ?6.  Health Maintenance ?- HM due items addressed Yes ?- Last pap smear No results found for: DIAGPAP Pap smear not done at today's visit per patient request, plans to return.  ?-Breast Cancer screening indicated? No.  ? ?7. Chronic Disease/Pregnancy Condition follow up: None ? ?Follow up in 2 months for pap smear and next depo injection.  ? ?Allayne Stack, DO ?[facility] for [employer], [facility]  ?

## 2022-02-21 ENCOUNTER — Ambulatory Visit (INDEPENDENT_AMBULATORY_CARE_PROVIDER_SITE_OTHER): Payer: Medicaid Other | Admitting: Student

## 2022-02-21 ENCOUNTER — Other Ambulatory Visit (HOSPITAL_COMMUNITY)
Admission: RE | Admit: 2022-02-21 | Discharge: 2022-02-21 | Disposition: A | Discharge status: Home / Self Care | Payer: Medicaid Other | Source: Ambulatory Visit | Attending: Student | Admitting: Student

## 2022-02-21 ENCOUNTER — Encounter: Payer: Self-pay | Admitting: Student

## 2022-02-21 VITALS — BP 111/58 | HR 62 | Wt 132.9 lb

## 2022-02-21 DIAGNOSIS — R87612 Low grade squamous intraepithelial lesion on cytologic smear of cervix (LGSIL): Secondary | ICD-10-CM | POA: Insufficient documentation

## 2022-02-21 DIAGNOSIS — Z124 Encounter for screening for malignant neoplasm of cervix: Secondary | ICD-10-CM

## 2022-02-21 DIAGNOSIS — N760 Acute vaginitis: Secondary | ICD-10-CM | POA: Diagnosis not present

## 2022-02-21 DIAGNOSIS — Z30014 Encounter for initial prescription of intrauterine contraceptive device: Secondary | ICD-10-CM

## 2022-02-21 DIAGNOSIS — B9689 Other specified bacterial agents as the cause of diseases classified elsewhere: Secondary | ICD-10-CM | POA: Diagnosis not present

## 2022-02-21 MED ORDER — MEDROXYPROGESTERONE ACETATE 150 MG/ML IM SUSP
150.0000 mg | Freq: Once | INTRAMUSCULAR | Status: AC
  Administered 2022-02-21: 150 mg via INTRAMUSCULAR

## 2022-02-22 LAB — CERVICOVAGINAL ANCILLARY ONLY
Bacterial Vaginitis (gardnerella): POSITIVE — AB
Candida Glabrata: NEGATIVE
Candida Vaginitis: NEGATIVE
Chlamydia: NEGATIVE
Comment: NEGATIVE
Comment: NEGATIVE
Comment: NEGATIVE
Comment: NEGATIVE
Comment: NEGATIVE
Comment: NORMAL
Neisseria Gonorrhea: NEGATIVE
Trichomonas: NEGATIVE

## 2022-02-22 NOTE — Progress Notes (Signed)
  History:  Ms. Ashly Yepez is a 21 y.o. G1P1001 who presents to clinic today for pap smear and Depo.   The following portions of the patient's history were reviewed and updated as appropriate: allergies, current medications, family history, past medical history, social history, past surgical history and problem list.  Review of Systems:  Review of Systems  Constitutional: Negative.   HENT: Negative.    Eyes: Negative.   Respiratory: Negative.    Cardiovascular: Negative.   Genitourinary: Negative.   Musculoskeletal: Negative.   Skin: Negative.   Neurological: Negative.      Objective:  Physical Exam BP (!) 111/58   Pulse 62   Wt 132 lb 14.4 oz (60.3 kg)   Breastfeeding Yes   BMI 22.81 kg/m  Physical Exam Pulmonary:     Effort: Pulmonary effort is normal.  Abdominal:     General: Abdomen is flat.  Genitourinary:    General: Normal vulva.     Vagina: Vaginal discharge present.     Comments: Grey milky discharge in vagina and positive Whiff test Musculoskeletal:        General: Normal range of motion.     Cervical back: Normal range of motion.  Skin:    General: Skin is warm.  Neurological:     General: No focal deficit present.     Mental Status: She is alert.      Labs and Imaging No results found for this or any previous visit (from the past 24 hour(s)).  No results found.  Health Maintenance Due  Topic Date Due   HPV VACCINES (1 - 2-dose series) Never done   PAP-Cervical Cytology Screening  Never done   PAP SMEAR-Modifier  Never done    Labs, imaging and previous visits in Epic and Care Everywhere reviewed  Assessment & Plan:  1. Encounter for Papanicolaou smear for cervical cancer screening -patient with symptoms that could be BV; will do wet prep -patient reports that she is otherwise doing well; enjoying her baby and nursing at night and formula feeding during the day -reviewed Depo schedule and importance of coming for 3 month appts - Cytology -  PAP( [place]) - Cervicovaginal ancillary only( [place])    Approximately 20 minutes of total time was spent with this patient on counseling and care.   Marylene Land, CNM 02/22/2022 6:49 AM

## 2022-02-23 ENCOUNTER — Other Ambulatory Visit: Payer: Self-pay | Admitting: Student

## 2022-02-23 MED ORDER — METRONIDAZOLE 500 MG PO TABS
500.0000 mg | ORAL_TABLET | Freq: Two times a day (BID) | ORAL | 0 refills | Status: DC
Start: 2022-02-23 — End: 2022-05-09

## 2022-02-26 LAB — CYTOLOGY - PAP

## 2022-05-09 ENCOUNTER — Ambulatory Visit (INDEPENDENT_AMBULATORY_CARE_PROVIDER_SITE_OTHER): Payer: Medicaid Other

## 2022-05-09 ENCOUNTER — Other Ambulatory Visit: Payer: Self-pay

## 2022-05-09 VITALS — BP 111/69 | HR 67

## 2022-05-09 DIAGNOSIS — Z3042 Encounter for surveillance of injectable contraceptive: Secondary | ICD-10-CM | POA: Diagnosis not present

## 2022-05-09 MED ORDER — MEDROXYPROGESTERONE ACETATE 150 MG/ML IM SUSP
150.0000 mg | Freq: Once | INTRAMUSCULAR | Status: AC
  Administered 2022-05-09: 150 mg via INTRAMUSCULAR

## 2022-05-09 NOTE — Progress Notes (Signed)
Carla Stevens here for Depo-Provera Injection. Injection administered without complication. Patient will return in 3 months for next injection between 07/25/22 and 08/08/22. Next annual visit due June 2024.   Marjo Bicker, RN 05/09/2022  9:15 AM

## 2022-05-27 IMAGING — CT CT ABD-PELV W/ CM
2 of 4 series · 16 of 46 positions shown, 18 images · IV contrast (omnipaque)
Comparison: None.

CLINICAL DATA: Worsening right-sided abdominal pain

EXAM:
CT ABDOMEN AND PELVIS WITH CONTRAST
TECHNIQUE: Multidetector CT imaging of the abdomen and pelvis was performed
using the standard protocol following bolus administration of
intravenous contrast.
CONTRAST:  100mL OMNIPAQUE IOHEXOL 300 MG/ML  SOLN

[Series 2: axial st · axial · 0.61mm/px · z∈[-440,-25]mm · 13 of 93 slices shown, 15 images]
[im 5/93  soft-tissue]
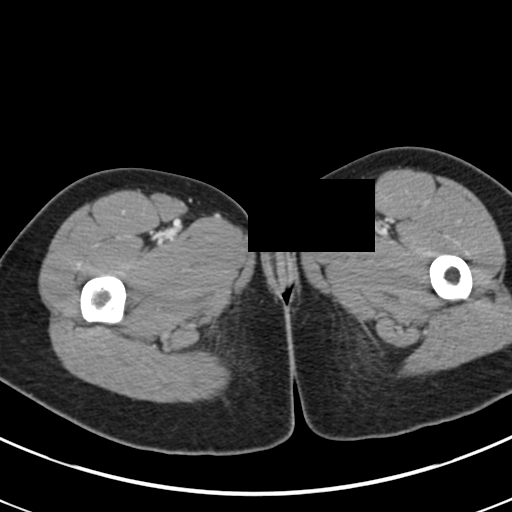
[im 5/93  bone]
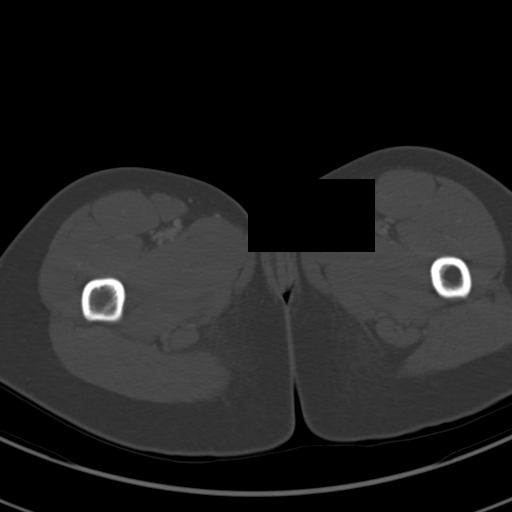
[im 14/93  soft-tissue]
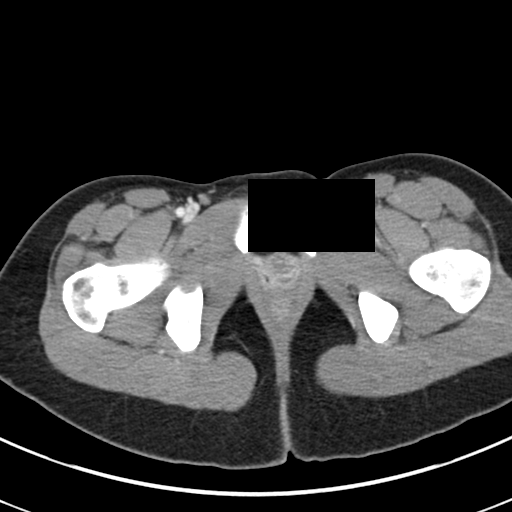
[im 19/93  soft-tissue]
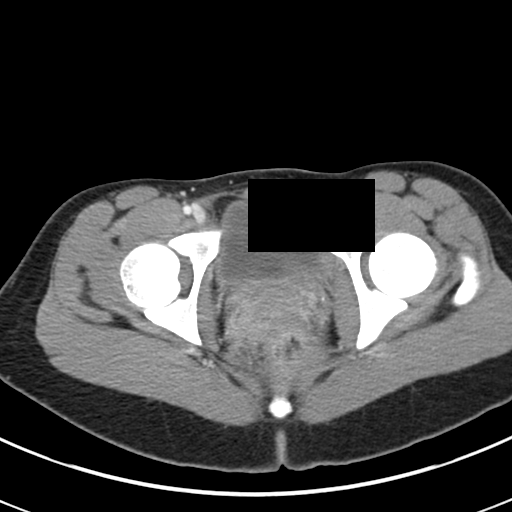
[im 28/93  soft-tissue]
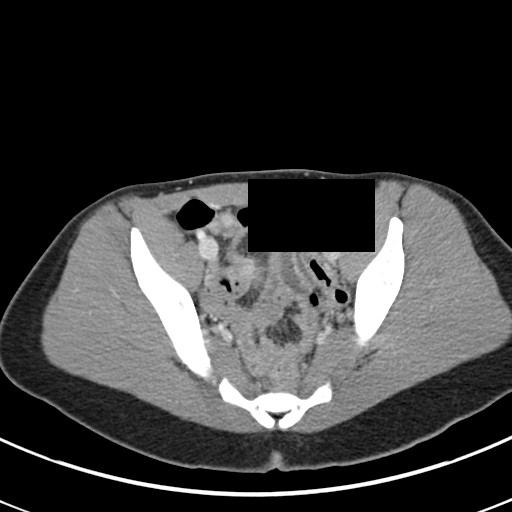
[im 33/93  soft-tissue]
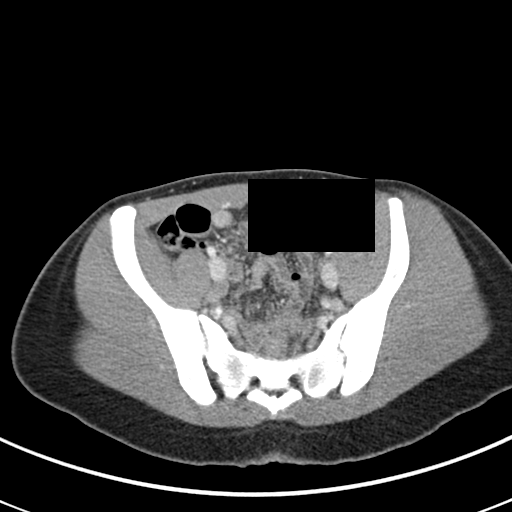
[im 42/93  soft-tissue]
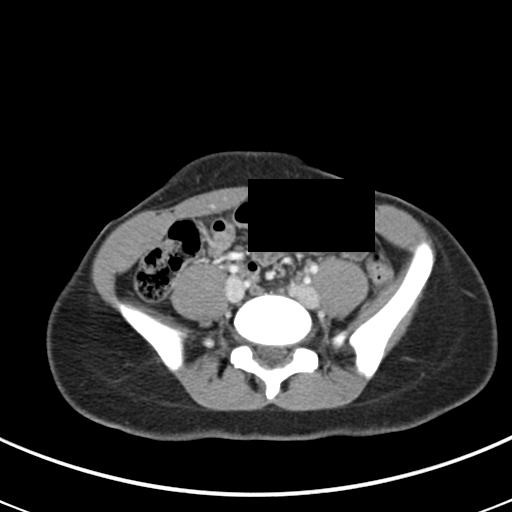
[im 47/93  soft-tissue]
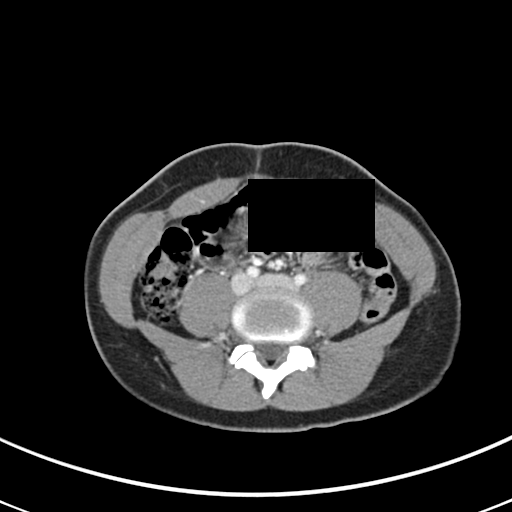
[im 51/93  soft-tissue]
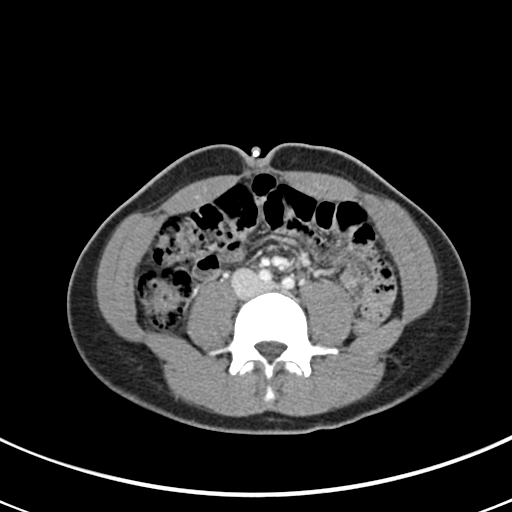
[im 60/93  soft-tissue]
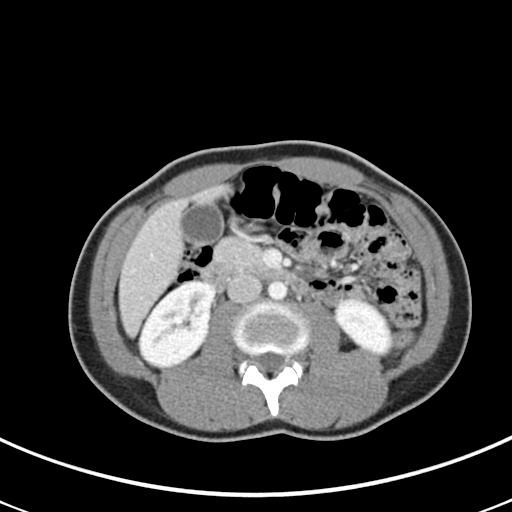
[im 60/93  bone]
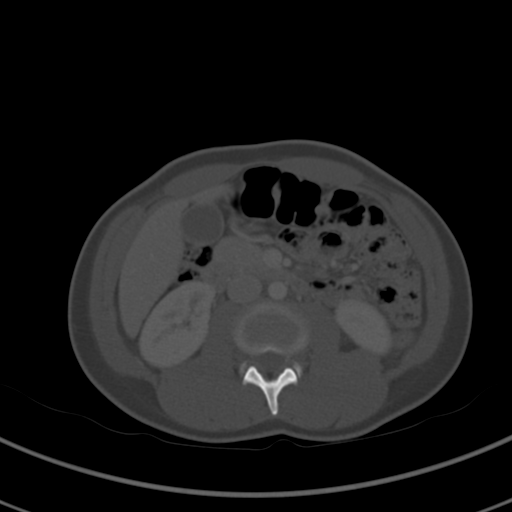
[im 65/93  soft-tissue]
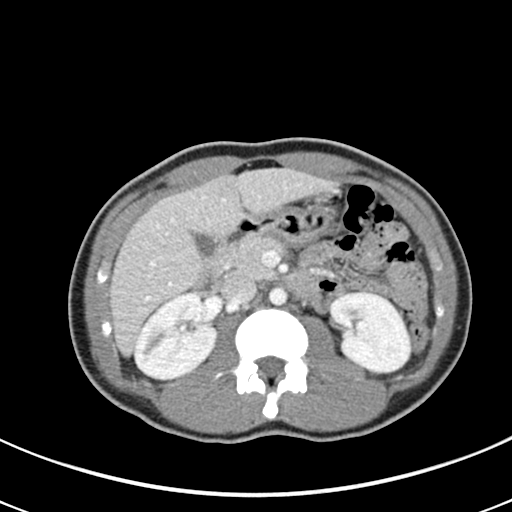
[im 74/93  soft-tissue]
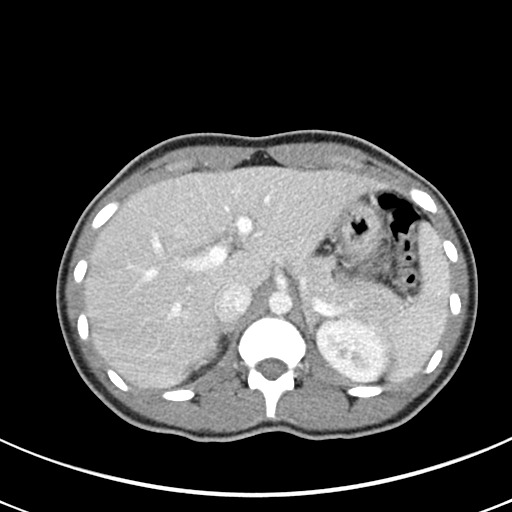
[im 79/93  soft-tissue]
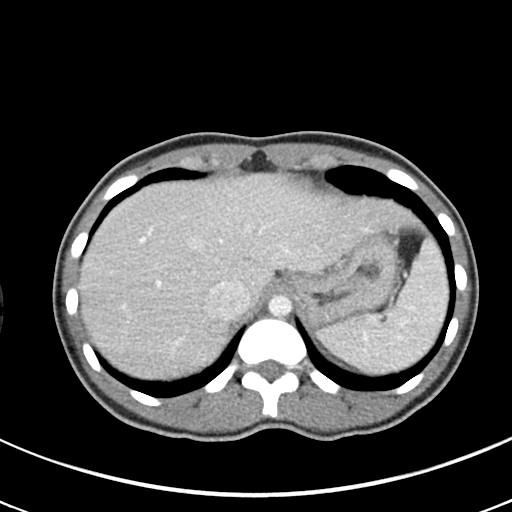
[im 88/93  soft-tissue]
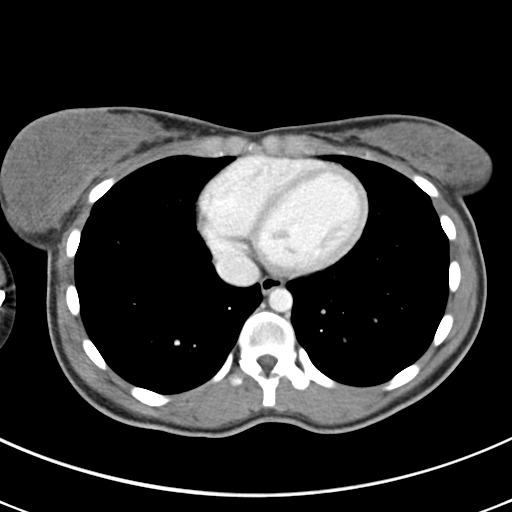

[Series 4: coronal st · coronal · 0.62mm/px · 3 of 105 slices shown]
[im 35/105  soft-tissue]
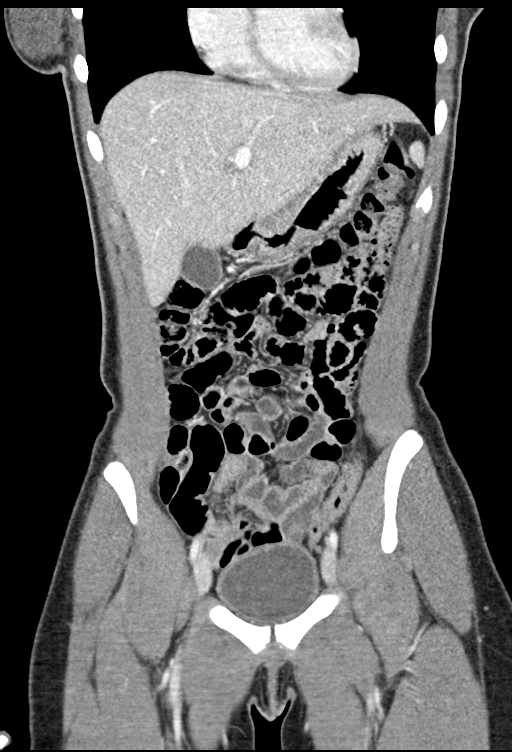
[im 47/105  soft-tissue]
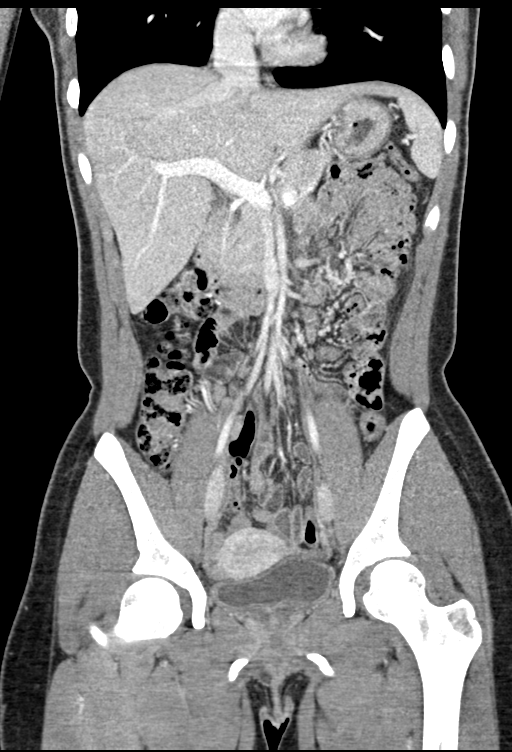
[im 58/105  soft-tissue]
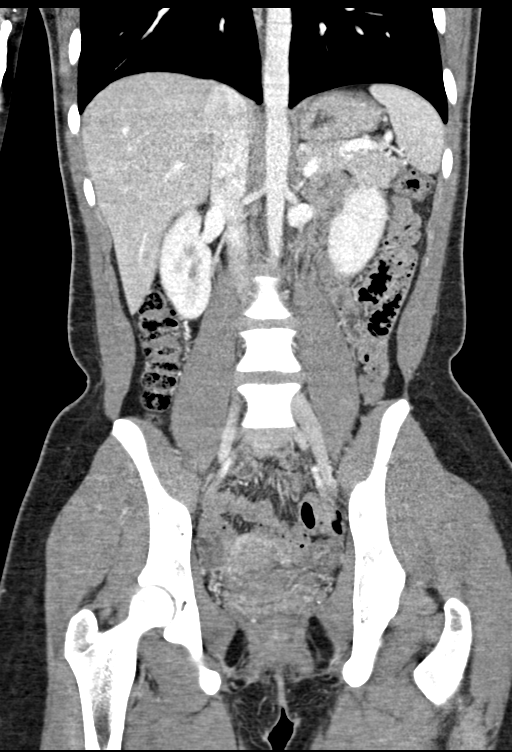

[16 of 46 positions shown; findings below may reference images not displayed]

FINDINGS: Lower chest: Unremarkable. Normal size heart. No significant
pericardial effusion/thickening.

Hepatobiliary: No suspicious hepatic lesion. Gallbladder is
unremarkable. No biliary ductal dilation.

Pancreas: Unremarkable. No pancreatic ductal dilatation or
surrounding inflammatory changes.

Spleen: Normal in size without focal abnormality.

Adrenals/Urinary Tract: Adrenal glands are unremarkable. Kidneys are
normal, without renal calculi, focal lesion, or hydronephrosis.
Bladder is unremarkable.

Stomach/Bowel: Stomach is within normal limits. Appendix appears
normal. No evidence of bowel wall thickening, distention, or
inflammatory changes.

Vascular/Lymphatic: No significant vascular findings are present. No
enlarged abdominal or pelvic lymph nodes.

Reproductive: Uterus and bilateral adnexa are unremarkable.

Other: Trace pelvic free fluid, likely physiologic.

Musculoskeletal: No acute or significant osseous findings.
IMPRESSION: 1. No acute abdominopelvic findings. Normal appendix.
2. Trace pelvic free fluid, likely physiologic.

## 2022-07-25 ENCOUNTER — Ambulatory Visit: Payer: Medicaid Other

## 2022-07-31 ENCOUNTER — Ambulatory Visit (INDEPENDENT_AMBULATORY_CARE_PROVIDER_SITE_OTHER): Payer: Medicaid Other | Admitting: General Practice

## 2022-07-31 VITALS — BP 106/59 | HR 56 | Wt 137.4 lb

## 2022-07-31 DIAGNOSIS — Z3042 Encounter for surveillance of injectable contraceptive: Secondary | ICD-10-CM | POA: Diagnosis not present

## 2022-07-31 MED ORDER — MEDROXYPROGESTERONE ACETATE 150 MG/ML IM SUSP
150.0000 mg | Freq: Once | INTRAMUSCULAR | Status: AC
  Administered 2022-07-31: 150 mg via INTRAMUSCULAR

## 2022-07-31 NOTE — Progress Notes (Signed)
Carla Stevens here for Depo-Provera Injection. Injection administered without complication. Patient will return in 3 months for next injection between 1/24 and 2/7. Next annual visit due June 2024.   Marylynn Pearson, RN 07/31/2022  10:21 AM

## 2022-10-16 ENCOUNTER — Ambulatory Visit (INDEPENDENT_AMBULATORY_CARE_PROVIDER_SITE_OTHER): Payer: Medicaid Other

## 2022-10-16 ENCOUNTER — Other Ambulatory Visit: Payer: Self-pay

## 2022-10-16 VITALS — BP 128/59 | HR 86 | Wt 137.3 lb

## 2022-10-16 DIAGNOSIS — Z3042 Encounter for surveillance of injectable contraceptive: Secondary | ICD-10-CM

## 2022-10-16 MED ORDER — MEDROXYPROGESTERONE ACETATE 150 MG/ML IM SUSP
150.0000 mg | Freq: Once | INTRAMUSCULAR | Status: AC
  Administered 2022-10-16: 150 mg via INTRAMUSCULAR

## 2022-10-16 NOTE — Progress Notes (Signed)
Carla Stevens here for Depo-Provera Injection. Injection administered without complication. Patient will return in 3 months for next injection between 01/02/23 and 01/16/23. Next annual visit due June 2024.   Annabell Howells, RN 10/16/2022  2:10 PM

## 2023-01-02 ENCOUNTER — Other Ambulatory Visit: Payer: Self-pay

## 2023-01-02 ENCOUNTER — Ambulatory Visit (INDEPENDENT_AMBULATORY_CARE_PROVIDER_SITE_OTHER): Payer: Medicaid Other

## 2023-01-02 VITALS — BP 104/63 | HR 62 | Ht 63.0 in | Wt 143.0 lb

## 2023-01-02 DIAGNOSIS — Z3042 Encounter for surveillance of injectable contraceptive: Secondary | ICD-10-CM | POA: Diagnosis not present

## 2023-01-02 MED ORDER — MEDROXYPROGESTERONE ACETATE 150 MG/ML IM SUSP
150.0000 mg | Freq: Once | INTRAMUSCULAR | Status: AC
  Administered 2023-01-02: 150 mg via INTRAMUSCULAR

## 2023-01-02 NOTE — Addendum Note (Signed)
Addended by: Faythe Casa on: 01/02/2023 03:38 PM   Modules accepted: Level of Service

## 2023-01-02 NOTE — Progress Notes (Addendum)
Carla Stevens here for Depo-Provera Injection. Injection administered without complication. Patient will return in 3 months for next injection between June 27 and July 11. Next annual visit due 02/2023.  Pt advised to schedule annual in July upon checking out at front office. Pt verbalized understanding with no further questions.   Ralene Bathe, RN 01/02/2023  3:01 PM

## 2023-01-23 ENCOUNTER — Ambulatory Visit
Admission: EM | Admit: 2023-01-23 | Discharge: 2023-01-23 | Disposition: A | Discharge status: Home / Self Care | Payer: Medicaid Other

## 2023-01-23 DIAGNOSIS — K029 Dental caries, unspecified: Secondary | ICD-10-CM | POA: Diagnosis not present

## 2023-01-23 DIAGNOSIS — K047 Periapical abscess without sinus: Secondary | ICD-10-CM | POA: Diagnosis not present

## 2023-01-23 MED ORDER — AMOXICILLIN 875 MG PO TABS: 875.0000 mg | ORAL_TABLET | Freq: Two times a day (BID) | ORAL | 0 refills | Status: AC

## 2023-01-23 MED ORDER — NAPROXEN 500 MG PO TABS: 500.0000 mg | ORAL_TABLET | Freq: Two times a day (BID) | ORAL | 0 refills | Status: AC

## 2023-01-23 NOTE — ED Provider Notes (Signed)
[facility]  Note:  This document was prepared using Conservation officer, historic buildings and may include unintentional dictation errors.  MRN: [id number] DOB: 2000-12-30  Subjective:   Carla Stevens is a 22 y.o. female presenting for 1 day history of left-sided dental pain, swelling of the gums.  Pain radiates to the left ear.  She does have a dentist.  Has a history of wisdom tooth extraction x 4.  She plans on following up with her dentist.  No fever, runny or stuffy nose, ear drainage, tinnitus.   No current facility-administered medications for this encounter.  Current Outpatient Medications:    medroxyPROGESTERone (DEPO-PROVERA) 150 MG/ML injection, Inject 150 mg into the muscle every 3 (three) months., Disp: , Rfl:    Prenatal Vit-Fe Fumarate-FA (MULTIVITAMIN-PRENATAL) 27-0.8 MG TABS tablet, Take 1 tablet by mouth daily at 12 noon., Disp: , Rfl:    Allergies  Allergen Reactions   Watermelon [Citrullus Vulgaris] Hives    Past Medical History:  Diagnosis Date   Encounter for supervision of normal first pregnancy in second trimester 03/27/2021    Nursing Staff Provider Office Location [facility] Dating   LMP consistent with 6 wk [place] Language  English Anatomy US   normal  Flu Vaccine  Declined  Genetic/Carrier Screen  NIPS:   LR female AFP:   normal Horizon: Neg  TDaP Vaccine  09/06/21 Hgb A1C or  GTT Early early hgbA1c 5.3 at NOB Third trimester   Glucose, Fasting 70 - 91 mg/dL 86  Glucose, 1 hour 70 - 179 mg/dL 409  Glucose, 2 hour 70 - 152 mg/d   Medical history non-contributory    UTI due to extended-spectrum beta lactamase (ESBL) producing Escherichia coli 04/30/2021   abx rx sent on 04/30/2021 toc neg     Past Surgical History:  Procedure Laterality Date   NO PAST SURGERIES      Family History  Problem Relation Age of Onset   Asthma Mother    Healthy Father    Cancer Maternal Grandmother    Kidney disease Maternal Grandfather     Social History   Tobacco  Use   Smoking status: Never    Passive exposure: Yes   Smokeless tobacco: Never  Vaping Use   Vaping Use: Some days  Substance Use Topics   Alcohol use: Yes    Comment: Socially   Drug use: Never    ROS   Objective:   Vitals: BP 104/69 (BP Location: Left Arm)   Pulse 69   Temp 99.2 F (37.3 C) (Oral)   Resp 16   SpO2 97%   Breastfeeding No   Physical Exam Constitutional:      General: She is not in acute distress.    Appearance: Normal appearance. She is well-developed and normal weight. She is not ill-appearing, toxic-appearing or diaphoretic.  HENT:     Head: Normocephalic and atraumatic.     Right Ear: Tympanic membrane, ear canal and external ear normal. No drainage or tenderness. No middle ear effusion. There is no impacted cerumen. Tympanic membrane is not erythematous or bulging.     Left Ear: Tympanic membrane, ear canal and external ear normal. No drainage or tenderness.  No middle ear effusion. There is no impacted cerumen. Tympanic membrane is not erythematous or bulging.     Nose: No congestion or rhinorrhea.     Mouth/Throat:     Mouth: Mucous membranes are moist. No oral lesions.     Pharynx: No pharyngeal swelling,  oropharyngeal exudate, posterior oropharyngeal erythema or uvula swelling.     Tonsils: No tonsillar exudate or tonsillar abscesses.   Eyes:     General: No scleral icterus.       Right eye: No discharge.        Left eye: No discharge.     Extraocular Movements: Extraocular movements intact.     Right eye: Normal extraocular motion.     Left eye: Normal extraocular motion.     Conjunctiva/sclera: Conjunctivae normal.  Cardiovascular:     Rate and Rhythm: Normal rate.  Pulmonary:     Effort: Pulmonary effort is normal.  Musculoskeletal:     Cervical back: Normal range of motion and neck supple.  Lymphadenopathy:     Cervical: No cervical adenopathy.  Skin:    General: Skin is warm and dry.  Neurological:     General: No focal deficit  present.     Mental Status: She is alert and oriented to person, place, and time.  Psychiatric:        Mood and Affect: Mood normal.        Behavior: Behavior normal.     Assessment and Plan :   PDMP not reviewed this encounter.  1. Dental infection   2. Dental caries    Start Augmentin for dental infection/abscess, use naproxen for pain and inflammation. Emphasized need for dental surgeon consult. Counseled patient on potential for adverse effects with medications prescribed/recommended today, strict ER and return-to-clinic precautions discussed, patient verbalized understanding.   Wallis Bamberg, PA-C 01/23/23 1155

## 2023-01-23 NOTE — ED Triage Notes (Signed)
Pt reports dental pain, left ear pain x 1 day.  Tylenol gives some relief.

## 2023-03-24 ENCOUNTER — Ambulatory Visit: Payer: Medicaid Other
